# Patient Record
Sex: Male | Born: 1965 | Race: White | Hispanic: No | Marital: Married | Smoking: Never smoker
Health system: Southern US, Community
[De-identification: ages and names within clinical notes are randomized; demographics above are authoritative.]

## PROBLEM LIST (undated history)

## (undated) DIAGNOSIS — I10 Essential (primary) hypertension: Secondary | ICD-10-CM

---

## 2020-03-30 ENCOUNTER — Encounter (HOSPITAL_BASED_OUTPATIENT_CLINIC_OR_DEPARTMENT_OTHER): Payer: Self-pay | Admitting: *Deleted

## 2020-03-30 ENCOUNTER — Emergency Department (HOSPITAL_BASED_OUTPATIENT_CLINIC_OR_DEPARTMENT_OTHER)
Admission: EM | Admit: 2020-03-30 | Discharge: 2020-03-30 | Disposition: A | Discharge status: Home / Self Care | Payer: BC Managed Care – PPO | Attending: Emergency Medicine | Admitting: Emergency Medicine

## 2020-03-30 ENCOUNTER — Other Ambulatory Visit: Payer: Self-pay

## 2020-03-30 DIAGNOSIS — E669 Obesity, unspecified: Secondary | ICD-10-CM | POA: Insufficient documentation

## 2020-03-30 DIAGNOSIS — X58XXXA Exposure to other specified factors, initial encounter: Secondary | ICD-10-CM | POA: Insufficient documentation

## 2020-03-30 DIAGNOSIS — S0502XA Injury of conjunctiva and corneal abrasion without foreign body, left eye, initial encounter: Secondary | ICD-10-CM | POA: Diagnosis present

## 2020-03-30 DIAGNOSIS — Y999 Unspecified external cause status: Secondary | ICD-10-CM | POA: Insufficient documentation

## 2020-03-30 DIAGNOSIS — H5712 Ocular pain, left eye: Secondary | ICD-10-CM | POA: Diagnosis not present

## 2020-03-30 DIAGNOSIS — Y929 Unspecified place or not applicable: Secondary | ICD-10-CM | POA: Diagnosis not present

## 2020-03-30 DIAGNOSIS — I10 Essential (primary) hypertension: Secondary | ICD-10-CM | POA: Diagnosis not present

## 2020-03-30 DIAGNOSIS — Y939 Activity, unspecified: Secondary | ICD-10-CM | POA: Insufficient documentation

## 2020-03-30 DIAGNOSIS — Z79899 Other long term (current) drug therapy: Secondary | ICD-10-CM | POA: Insufficient documentation

## 2020-03-30 HISTORY — DX: Essential (primary) hypertension: I10

## 2020-03-30 MED ORDER — CYCLOPENTOLATE HCL 1 % OP SOLN
2.0000 [drp] | Freq: Once | OPHTHALMIC | Status: AC
  Administered 2020-03-30: 2 [drp] via OPHTHALMIC
  Filled 2020-03-30: qty 2

## 2020-03-30 MED ORDER — CIPROFLOXACIN HCL 0.3 % OP SOLN: 2.0000 [drp] | OPHTHALMIC | 0 refills | Status: AC

## 2020-03-30 MED ORDER — TETRACAINE HCL 0.5 % OP SOLN
2.0000 [drp] | Freq: Once | OPHTHALMIC | Status: AC
  Administered 2020-03-30: 2 [drp] via OPHTHALMIC
  Filled 2020-03-30: qty 4

## 2020-03-30 MED ORDER — HYDROCODONE-ACETAMINOPHEN 5-325 MG PO TABS: 1.0000 | ORAL_TABLET | ORAL | 0 refills | Status: DC | PRN

## 2020-03-30 MED ORDER — IBUPROFEN 600 MG PO TABS: 600.0000 mg | ORAL_TABLET | Freq: Four times a day (QID) | ORAL | 0 refills | Status: DC | PRN

## 2020-03-30 MED ORDER — CIPROFLOXACIN HCL 0.3 % OP SOLN
2.0000 [drp] | Freq: Once | OPHTHALMIC | Status: DC
  Filled 2020-03-30: qty 2.5

## 2020-03-30 MED ORDER — FLUORESCEIN SODIUM 1 MG OP STRP
1.0000 | ORAL_STRIP | Freq: Once | OPHTHALMIC | Status: AC
  Administered 2020-03-30: 1 via OPHTHALMIC
  Filled 2020-03-30: qty 1

## 2020-03-30 NOTE — ED Notes (Signed)
Attempted visual acuity, Pt stated that he could not read the sign due to hot having his glasses. Pt was able to read his labels up close.

## 2020-03-30 NOTE — ED Provider Notes (Signed)
[facility] EMERGENCY DEPARTMENT Provider Note   CSN: [id number] Arrival date & time: 03/30/20  1913     History Chief Complaint  Patient presents with  . Eye Problem    Sergio Mcdonald is a 54 y.o. male.  Pt presents to the ED today with left eye pain.  It started today.  He felt like something went in it and then it has been hurting ever since.  Pt denies any f/c.         Past Medical History:  Diagnosis Date  . Hypertension     There are no problems to display for this patient.   History reviewed. No pertinent surgical history.     History reviewed. No pertinent family history.  Social History   Tobacco Use  . Smoking status: Never Smoker  . Smokeless tobacco: Never Used  Substance Use Topics  . Alcohol use: Not Currently  . Drug use: Not Currently    Home Medications Prior to Admission medications   Medication Sig Start Date End Date Taking? Authorizing Provider  ciprofloxacin (CILOXAN) 0.3 % ophthalmic solution Place 2 drops into the left eye every 2 (two) hours while awake. Administer 1 drop, every 2 hours, while awake, for 2 days. Then 1 drop, every 4 hours, while awake, for the next 5 days. 03/30/20   Jacalyn Lefevre, MD  HYDROcodone-acetaminophen (NORCO/VICODIN) 5-325 MG tablet Take 1 tablet by mouth every 4 (four) hours as needed. 03/30/20   Jacalyn Lefevre, MD  ibuprofen (ADVIL) 600 MG tablet Take 1 tablet (600 mg total) by mouth every 6 (six) hours as needed. 03/30/20   Jacalyn Lefevre, MD    Allergies    Penicillins and Zithromax [azithromycin]  Review of Systems   Review of Systems  Eyes: Positive for photophobia, pain and redness.  All other systems reviewed and are negative.   Physical Exam Updated Vital Signs BP (!) 163/112 (BP Location: Right Arm)   Pulse 97   Temp 98.4 F (36.9 C) (Oral)   Resp 20   Ht 5\' 8"  (1.727 m)   Wt 127 kg   SpO2 95%   BMI 42.57 kg/m   Physical Exam Vitals and nursing note reviewed.    Constitutional:      Appearance: Normal appearance. He is obese.  HENT:     Head: Normocephalic and atraumatic.     Right Ear: External ear normal.     Left Ear: External ear normal.     Nose: Nose normal.     Mouth/Throat:     Mouth: Mucous membranes are moist.     Pharynx: Oropharynx is clear.  Eyes:     Conjunctiva/sclera:     Left eye: Left conjunctiva is injected.     Pupils: Pupils are equal, round, and reactive to light.     Left eye: Corneal abrasion present.  Cardiovascular:     Rate and Rhythm: Normal rate and regular rhythm.     Pulses: Normal pulses.     Heart sounds: Normal heart sounds.  Pulmonary:     Effort: Pulmonary effort is normal.     Breath sounds: Normal breath sounds.  Abdominal:     General: Abdomen is flat. Bowel sounds are normal.     Palpations: Abdomen is soft.  Musculoskeletal:        General: Normal range of motion.     Cervical back: Normal range of motion and neck supple.  Skin:    General: Skin is warm.  Capillary Refill: Capillary refill takes less than 2 seconds.  Neurological:     General: No focal deficit present.     Mental Status: He is alert and oriented to person, place, and time.  Psychiatric:        Mood and Affect: Mood normal.        Behavior: Behavior normal.     ED Results / Procedures / Treatments   Labs (all labs ordered are listed, but only abnormal results are displayed) Labs Reviewed - No data to display  EKG None  Radiology No results found.  Procedures Procedures (including critical care time)  Medications Ordered in ED Medications  fluorescein ophthalmic strip 1 strip (1 strip Left Eye Given 03/30/20 2149)  tetracaine (PONTOCAINE) 0.5 % ophthalmic solution 2 drop (2 drops Left Eye Given by Other 03/30/20 2148)  cyclopentolate (CYCLODRYL,CYCLOGYL) 1 % ophthalmic solution 2 drop (2 drops Left Eye Given 03/30/20 2222)    ED Course  I have reviewed the triage vital signs and the nursing  notes.  Pertinent labs & imaging results that were available during my care of the patient were reviewed by me and considered in my medical decision making (see chart for details).    MDM Rules/Calculators/A&P                           Pt with a large corneal abrasion.  He is unable to see the visual acuity eye chart with either eye (he did not bring his glasses).  Pt is to f/u with ophthalmology.  Return if worse.  Final Clinical Impression(s) / ED Diagnoses Final diagnoses:  Abrasion of left cornea, initial encounter    Rx / DC Orders ED Discharge Orders         Ordered    ciprofloxacin (CILOXAN) 0.3 % ophthalmic solution  Every 2 hours while awake        03/30/20 2222    HYDROcodone-acetaminophen (NORCO/VICODIN) 5-325 MG tablet  Every 4 hours PRN        03/30/20 2222    ibuprofen (ADVIL) 600 MG tablet  Every 6 hours PRN        03/30/20 2222           Jacalyn Lefevre, MD 03/30/20 2226

## 2020-03-30 NOTE — ED Triage Notes (Signed)
Pt reports left eye burning and pain x 1 days. Unknown injury.

## 2020-04-14 ENCOUNTER — Other Ambulatory Visit: Payer: Self-pay

## 2020-04-14 ENCOUNTER — Inpatient Hospital Stay (HOSPITAL_BASED_OUTPATIENT_CLINIC_OR_DEPARTMENT_OTHER)
Admission: EM | Admit: 2020-04-14 | Discharge: 2020-04-17 | DRG: 177 | Disposition: A | Discharge status: Home / Self Care | Payer: BC Managed Care – PPO | Attending: Internal Medicine | Admitting: Internal Medicine

## 2020-04-14 ENCOUNTER — Encounter (HOSPITAL_BASED_OUTPATIENT_CLINIC_OR_DEPARTMENT_OTHER): Payer: Self-pay | Admitting: Emergency Medicine

## 2020-04-14 ENCOUNTER — Emergency Department (HOSPITAL_BASED_OUTPATIENT_CLINIC_OR_DEPARTMENT_OTHER): Payer: BC Managed Care – PPO

## 2020-04-14 DIAGNOSIS — U071 COVID-19: Secondary | ICD-10-CM | POA: Diagnosis present

## 2020-04-14 DIAGNOSIS — I10 Essential (primary) hypertension: Secondary | ICD-10-CM | POA: Diagnosis present

## 2020-04-14 DIAGNOSIS — E669 Obesity, unspecified: Secondary | ICD-10-CM

## 2020-04-14 DIAGNOSIS — R0602 Shortness of breath: Secondary | ICD-10-CM | POA: Diagnosis present

## 2020-04-14 DIAGNOSIS — Z79899 Other long term (current) drug therapy: Secondary | ICD-10-CM

## 2020-04-14 DIAGNOSIS — J9601 Acute respiratory failure with hypoxia: Secondary | ICD-10-CM | POA: Diagnosis present

## 2020-04-14 DIAGNOSIS — J1282 Pneumonia due to coronavirus disease 2019: Secondary | ICD-10-CM | POA: Diagnosis present

## 2020-04-14 DIAGNOSIS — R0902 Hypoxemia: Secondary | ICD-10-CM | POA: Diagnosis not present

## 2020-04-14 DIAGNOSIS — Z6841 Body Mass Index (BMI) 40.0 and over, adult: Secondary | ICD-10-CM

## 2020-04-14 LAB — CBC WITH DIFFERENTIAL/PLATELET
Abs Immature Granulocytes: 0.03 10*3/uL (ref 0.00–0.07)
Basophils Absolute: 0 10*3/uL (ref 0.0–0.1)
Basophils Relative: 0 %
Eosinophils Absolute: 0 10*3/uL (ref 0.0–0.5)
Eosinophils Relative: 0 %
HCT: 43.2 % (ref 39.0–52.0)
Hemoglobin: 14.5 g/dL (ref 13.0–17.0)
Immature Granulocytes: 0 %
Lymphocytes Relative: 7 %
Lymphs Abs: 0.5 10*3/uL — ABNORMAL LOW (ref 0.7–4.0)
MCH: 27.5 pg (ref 26.0–34.0)
MCHC: 33.6 g/dL (ref 30.0–36.0)
MCV: 82 fL (ref 80.0–100.0)
Monocytes Absolute: 0.3 10*3/uL (ref 0.1–1.0)
Monocytes Relative: 4 %
Neutro Abs: 5.9 10*3/uL (ref 1.7–7.7)
Neutrophils Relative %: 89 %
Platelets: 229 10*3/uL (ref 150–400)
RBC: 5.27 MIL/uL (ref 4.22–5.81)
RDW: 13.2 % (ref 11.5–15.5)
WBC: 6.7 10*3/uL (ref 4.0–10.5)
nRBC: 0 % (ref 0.0–0.2)

## 2020-04-14 LAB — COMPREHENSIVE METABOLIC PANEL
ALT: 57 U/L — ABNORMAL HIGH (ref 0–44)
AST: 43 U/L — ABNORMAL HIGH (ref 15–41)
Albumin: 3.6 g/dL (ref 3.5–5.0)
Alkaline Phosphatase: 33 U/L — ABNORMAL LOW (ref 38–126)
Anion gap: 13 (ref 5–15)
BUN: 19 mg/dL (ref 6–20)
CO2: 25 mmol/L (ref 22–32)
Calcium: 8.2 mg/dL — ABNORMAL LOW (ref 8.9–10.3)
Chloride: 94 mmol/L — ABNORMAL LOW (ref 98–111)
Creatinine, Ser: 1.07 mg/dL (ref 0.61–1.24)
GFR calc Af Amer: >60 mL/min (ref >60)
GFR calc non Af Amer: >60 mL/min (ref >60)
Glucose, Bld: 126 mg/dL — ABNORMAL HIGH (ref 70–99)
Potassium: 3.6 mmol/L (ref 3.5–5.1)
Sodium: 132 mmol/L — ABNORMAL LOW (ref 135–145)
Total Bilirubin: 0.7 mg/dL (ref 0.3–1.2)
Total Protein: 6.9 g/dL (ref 6.5–8.1)

## 2020-04-14 LAB — URINALYSIS, ROUTINE W REFLEX MICROSCOPIC
Bilirubin Urine: NEGATIVE
Glucose, UA: NEGATIVE mg/dL
Hgb urine dipstick: NEGATIVE
Ketones, ur: NEGATIVE mg/dL
Leukocytes,Ua: NEGATIVE
Nitrite: NEGATIVE
Protein, ur: NEGATIVE mg/dL
Specific Gravity, Urine: 1.015 (ref 1.005–1.030)
pH: 6 (ref 5.0–8.0)

## 2020-04-14 LAB — PROCALCITONIN: Procalcitonin: 0.14 ng/mL

## 2020-04-14 LAB — TRIGLYCERIDES: Triglycerides: 95 mg/dL (ref <150)

## 2020-04-14 LAB — D-DIMER, QUANTITATIVE: D-Dimer, Quant: 0.75 ug/mL-FEU — ABNORMAL HIGH (ref 0.00–0.50)

## 2020-04-14 LAB — C-REACTIVE PROTEIN: CRP: 7.7 mg/dL — ABNORMAL HIGH (ref <1.0)

## 2020-04-14 LAB — LACTIC ACID, PLASMA: Lactic Acid, Venous: 1.1 mmol/L (ref 0.5–1.9)

## 2020-04-14 LAB — FERRITIN: Ferritin: 1886 ng/mL — ABNORMAL HIGH (ref 24–336)

## 2020-04-14 LAB — LACTATE DEHYDROGENASE: LDH: 354 U/L — ABNORMAL HIGH (ref 98–192)

## 2020-04-14 LAB — FIBRINOGEN: Fibrinogen: 686 mg/dL — ABNORMAL HIGH (ref 210–475)

## 2020-04-14 MED ORDER — ENOXAPARIN SODIUM 60 MG/0.6ML ~~LOC~~ SOLN
60.0000 mg | SUBCUTANEOUS | Status: DC
  Administered 2020-04-14 – 2020-04-16 (×3): 60 mg via SUBCUTANEOUS
  Filled 2020-04-14 (×3): qty 0.6

## 2020-04-14 MED ORDER — METHYLPREDNISOLONE SODIUM SUCC 125 MG IJ SOLR: 1.0000 mg/kg | Freq: Two times a day (BID) | INTRAMUSCULAR | Status: DC

## 2020-04-14 MED ORDER — TOBRAMYCIN-DEXAMETHASONE 0.3-0.1 % OP SUSP
1.0000 [drp] | OPHTHALMIC | Status: DC
  Administered 2020-04-14 – 2020-04-17 (×13): 1 [drp] via OPHTHALMIC
  Filled 2020-04-14: qty 2.5

## 2020-04-14 MED ORDER — LISINOPRIL 40 MG PO TABS
40.0000 mg | ORAL_TABLET | Freq: Every day | ORAL | Status: DC
  Administered 2020-04-14 – 2020-04-17 (×4): 40 mg via ORAL
  Filled 2020-04-14 (×4): qty 1

## 2020-04-14 MED ORDER — SODIUM CHLORIDE 0.9 % IV SOLN
100.0000 mg | Freq: Every day | INTRAVENOUS | Status: DC
  Administered 2020-04-15 – 2020-04-17 (×3): 100 mg via INTRAVENOUS
  Filled 2020-04-14 (×3): qty 20

## 2020-04-14 MED ORDER — PREDNISONE 20 MG PO TABS: 50.0000 mg | ORAL_TABLET | Freq: Every day | ORAL | Status: DC

## 2020-04-14 MED ORDER — SODIUM CHLORIDE 0.9 % IV SOLN
100.0000 mg | INTRAVENOUS | Status: AC
  Administered 2020-04-14: 100 mg via INTRAVENOUS
  Filled 2020-04-14 (×2): qty 20

## 2020-04-14 MED ORDER — CIPROFLOXACIN HCL 0.3 % OP SOLN
2.0000 [drp] | OPHTHALMIC | Status: DC
  Filled 2020-04-14: qty 2.5

## 2020-04-14 MED ORDER — HYDROCOD POLST-CPM POLST ER 10-8 MG/5ML PO SUER: 5.0000 mL | Freq: Two times a day (BID) | ORAL | Status: DC | PRN

## 2020-04-14 MED ORDER — GUAIFENESIN-DM 100-10 MG/5ML PO SYRP: 10.0000 mL | ORAL_SOLUTION | ORAL | Status: DC | PRN

## 2020-04-14 MED ORDER — DEXAMETHASONE SODIUM PHOSPHATE 10 MG/ML IJ SOLN
10.0000 mg | Freq: Once | INTRAMUSCULAR | Status: AC
  Administered 2020-04-14: 10 mg via INTRAVENOUS
  Filled 2020-04-14: qty 1

## 2020-04-14 MED ORDER — PANTOPRAZOLE SODIUM 40 MG PO TBEC
40.0000 mg | DELAYED_RELEASE_TABLET | Freq: Every day | ORAL | Status: DC
  Administered 2020-04-14 – 2020-04-17 (×4): 40 mg via ORAL
  Filled 2020-04-14 (×4): qty 1

## 2020-04-14 MED ORDER — METHYLPREDNISOLONE SODIUM SUCC 125 MG IJ SOLR
80.0000 mg | Freq: Two times a day (BID) | INTRAMUSCULAR | Status: AC
  Administered 2020-04-14 – 2020-04-17 (×6): 80 mg via INTRAVENOUS
  Filled 2020-04-14 (×6): qty 2

## 2020-04-14 MED ORDER — SODIUM CHLORIDE 0.9 % IV SOLN
INTRAVENOUS | Status: DC | PRN
  Administered 2020-04-14: 500 mL via INTRAVENOUS

## 2020-04-14 MED ORDER — SODIUM CHLORIDE 0.9 % IV SOLN
100.0000 mg | Freq: Every day | INTRAVENOUS | Status: DC
  Administered 2020-04-14: 100 mg via INTRAVENOUS

## 2020-04-14 NOTE — ED Provider Notes (Signed)
[facility] EMERGENCY DEPARTMENT Provider Note   CSN: [id number] Arrival date & time: 04/14/20  0028     History Chief Complaint  Patient presents with  . Shortness of Breath    Sergio Mcdonald is a 54 y.o. male.  HPI     This a 54 year old male with a history of hypertension who presents with shortness of breath.  Patient was diagnosed with COVID-19 on 9/7.  He reports worsening shortness of breath and cough.  No fevers, nausea, vomiting, diarrhea.  Denies chest pain.  Shortness of breath that significantly worsened with exertion.  He did receive outpatient Regeneron therapy.  He is a non-smoker.  No history of asthma.  Past Medical History:  Diagnosis Date  . Hypertension     There are no problems to display for this patient.   History reviewed. No pertinent surgical history.     No family history on file.  Social History   Tobacco Use  . Smoking status: Never Smoker  . Smokeless tobacco: Never Used  Substance Use Topics  . Alcohol use: Not Currently  . Drug use: Not Currently    Home Medications Prior to Admission medications   Medication Sig Start Date End Date Taking? Authorizing Provider  lisinopril (ZESTRIL) 40 MG tablet Take 40 mg by mouth daily.   Yes [provider]  omeprazole (PRILOSEC) 40 MG capsule Take 40 mg by mouth daily.   Yes [provider]  ciprofloxacin (CILOXAN) 0.3 % ophthalmic solution Place 2 drops into the left eye every 2 (two) hours while awake. Administer 1 drop, every 2 hours, while awake, for 2 days. Then 1 drop, every 4 hours, while awake, for the next 5 days. 03/30/20   Jacalyn Lefevre, MD  HYDROcodone-acetaminophen (NORCO/VICODIN) 5-325 MG tablet Take 1 tablet by mouth every 4 (four) hours as needed. 03/30/20   Jacalyn Lefevre, MD  ibuprofen (ADVIL) 600 MG tablet Take 1 tablet (600 mg total) by mouth every 6 (six) hours as needed. 03/30/20   Jacalyn Lefevre, MD    Allergies    Penicillins and Zithromax  [azithromycin]  Review of Systems   Review of Systems  Constitutional: Negative for fever.  Respiratory: Positive for cough and shortness of breath.   Cardiovascular: Negative for chest pain.  Gastrointestinal: Negative for abdominal pain, nausea and vomiting.  Genitourinary: Negative for dysuria.  All other systems reviewed and are negative.   Physical Exam Updated Vital Signs BP 112/77   Pulse 80   Temp 98.2 F (36.8 C) (Oral)   Resp 18   Ht 1.727 m (5\' 8" )   Wt 124.7 kg   SpO2 97%   BMI 41.81 kg/m   Physical Exam Vitals and nursing note reviewed.  Constitutional:      Appearance: He is well-developed. He is obese. He is not ill-appearing.  HENT:     Head: Normocephalic and atraumatic.     Mouth/Throat:     Mouth: Mucous membranes are moist.  Eyes:     Pupils: Pupils are equal, round, and reactive to light.  Cardiovascular:     Rate and Rhythm: Normal rate and regular rhythm.     Heart sounds: Normal heart sounds. No murmur heard.   Pulmonary:     Effort: Pulmonary effort is normal. No respiratory distress.     Breath sounds: Normal breath sounds. No wheezing.     Comments: Distant breath sounds in all lung fields Abdominal:     General: Bowel sounds are normal.  Palpations: Abdomen is soft.     Tenderness: There is no abdominal tenderness. There is no rebound.  Musculoskeletal:     Cervical back: Neck supple.     Right lower leg: No tenderness. No edema.     Left lower leg: No tenderness. No edema.  Lymphadenopathy:     Cervical: No cervical adenopathy.  Skin:    General: Skin is warm and dry.  Neurological:     Mental Status: He is alert and oriented to person, place, and time.  Psychiatric:        Mood and Affect: Mood normal.     ED Results / Procedures / Treatments   Labs (all labs ordered are listed, but only abnormal results are displayed) Labs Reviewed  CBC WITH DIFFERENTIAL/PLATELET - Abnormal; Notable for the following components:       Result Value   Lymphs Abs 0.5 (*)    All other components within normal limits  COMPREHENSIVE METABOLIC PANEL - Abnormal; Notable for the following components:   Sodium 132 (*)    Chloride 94 (*)    Glucose, Bld 126 (*)    Calcium 8.2 (*)    AST 43 (*)    ALT 57 (*)    Alkaline Phosphatase 33 (*)    All other components within normal limits  D-DIMER, QUANTITATIVE (NOT AT [facility]) - Abnormal; Notable for the following components:   D-Dimer, Quant 0.75 (*)    All other components within normal limits  CULTURE, BLOOD (ROUTINE X 2)  CULTURE, BLOOD (ROUTINE X 2)  LACTIC ACID, PLASMA  PROCALCITONIN  LACTATE DEHYDROGENASE  FERRITIN  TRIGLYCERIDES  FIBRINOGEN  C-REACTIVE PROTEIN    EKG EKG Interpretation  Date/Time:  Monday April 14 2020 00:40:37 EDT Ventricular Rate:  91 PR Interval:  156 QRS Duration: 98 QT Interval:  354 QTC Calculation: 435 R Axis:   3 Text Interpretation: Normal sinus rhythm Normal ECG Confirmed by Ross Marcus ([id number]) on 04/14/2020 1:43:09 AM   Radiology DG Chest Port 1 View  Result Date: 04/14/2020 CLINICAL DATA:  COVID positive with symptoms since September 3rd. EXAM: PORTABLE CHEST 1 VIEW COMPARISON:  August 22, 2018 FINDINGS: Mildly decreased lung volumes are seen which is likely, in part, secondary to suboptimal patient inspiration. Mild ill-defined areas of atelectasis and/or early infiltrate are noted within the bilateral lung bases. There is no evidence of a pleural effusion or pneumothorax. The cardiac silhouette is mildly enlarged and unchanged in size. The visualized skeletal structures are unremarkable. IMPRESSION: 1. Mild ill-defined bibasilar atelectasis and/or early infiltrate. Electronically Signed   By: Aram Candela M.D.   On: 04/14/2020 01:29    Procedures .Critical Care Performed by: Shon Baton, MD Authorized by: Shon Baton, MD   Critical care provider statement:    Critical care time (minutes):  35    Critical care was time spent personally by me on the following activities:  Discussions with consultants, evaluation of patient's response to treatment, examination of patient, ordering and performing treatments and interventions, ordering and review of laboratory studies, ordering and review of radiographic studies, pulse oximetry, re-evaluation of patient's condition, obtaining history from patient or surrogate and review of old charts   (including critical care time)    Medications Ordered in ED Medications  dexamethasone (DECADRON) injection 10 mg (has no administration in time range)    ED Course  I have reviewed the triage vital signs and the nursing notes.  Pertinent labs & imaging results that were available during  my care of the patient were reviewed by me and considered in my medical decision making (see chart for details).    MDM Rules/Calculators/A&P                           Patient presents with shortness of breath.  Positive for COVID-19 on 9/7.  He has had progressively worsening shortness of breath.  Initial O2 sats 89 to 90%.  He does seem to improve with rest but has significant tachypnea and hypoxia with ambulation.  He was placed on 2 L of oxygen for support.  He has a history of hypertension and obesity but no other risk factors.  He is a non-smoker.  He has received outpatient monoclonal antibodies.  Work-up initiated.  Chest x-ray shows early infiltrates.  Likely consistent with COVID-19 pneumonia.  Lactate is normal.  White count is normal.  Lab work-up is largely reassuring.  Several preadmission Covid labs are pending.  However, given oxygen requirement, feel he will need admission and will likely worsen given course of illness.  Patient was given Decadron and remdesivir was ordered.  We will plan for admission to the hospitalist.  Sergio Mcdonald was evaluated in Emergency Department on 04/14/2020 for the symptoms described in the history of present illness. He was  evaluated in the context of the global COVID-19 pandemic, which necessitated consideration that the patient might be at risk for infection with the SARS-CoV-2 virus that causes COVID-19. Institutional protocols and algorithms that pertain to the evaluation of patients at risk for COVID-19 are in a state of rapid change based on information released by regulatory bodies including the CDC and federal and state organizations. These policies and algorithms were followed during the patient's care in the ED.   Final Clinical Impression(s) / ED Diagnoses Final diagnoses:  Shortness of breath  Hypoxia  COVID-19    Rx / DC Orders ED Discharge Orders    None       Raymel Cull, Mayer Masker, MD 04/14/20 0422

## 2020-04-14 NOTE — ED Notes (Addendum)
PT urine sent to lab. Abnormal color noted. Culture obtained.

## 2020-04-14 NOTE — ED Triage Notes (Signed)
Reports he was diagnosed with covid on Tuesday.  SOB has gotten increasingly worse.  Noted to be labored with exertion.

## 2020-04-14 NOTE — ED Notes (Signed)
Attempted to call pts wife at his request. No answer.

## 2020-04-14 NOTE — ED Notes (Signed)
RN updated pts wife on his condition at his request. Heidi : [phone].

## 2020-04-14 NOTE — ED Notes (Signed)
RN to room to hang second bag of Remdesivir. First bag still full despite hanging as piggy back with clamp unlocked. Secondary set changed to new tubing, med infusing.

## 2020-04-14 NOTE — H&P (Signed)
[facility] H&P   Patient Demographics:    Lieutenant Abarca, is a 54 y.o. male  MRN: [id number]   DOB - 1966/07/16  Admit Date - 04/14/2020  Outpatient Primary MD for the patient is Patient, No Pcp Per  Referring MD/NP/PA: from [facility] accepted by Dr. Leafy Half.  Patient coming [facility]: [facility]  Chief Complaint  Patient presents with  . Shortness of Breath    + covid      HPI:    Secundino Ellithorpe  is a 54 y.o. male, with past medical history of hypertension, obesity, patient started with COVID-19 symptoms 9/3, he tested positive on 9/7, patient did receive monoclonal antibody 9/10 at [facility], patient does report developing dyspnea over last 2 days, report is mainly exertional, as well he does report cough, nonproductive, he does report intermittent fever and chills, he does report generalized weakness, fatigue, but he denies nausea, vomiting, diarrhea, abdominal pain, patient is unvaccinated against Covid, as well reports his wife has tested positive as well, she is vaccinated, she has no symptoms. -Patient presents to [facility] with dyspnea, chest x-ray significant for bibasilar opacities, T-max 99.8, CRP elevated at 7.7, ferritin elevated at 1.8, his LFTs mildly elevated with AST at 43, ALT at 57, patient was started on IV steroids, remdesivir, and [facility] hospitalist requested to admit.    Review of systems:    In addition to the HPI above,  He reports fever and chills,he reports fatigue No Headache, No changes with Vision or hearing, No problems swallowing food or Liquids, No Chest pain,he reports  Cough and Shortness of Breath, No Abdominal pain, No Nausea or Vommitting, Bowel movements are regular, No Blood in stool or Urine, No dysuria, No new skin rashes or bruises, No new joints pains-aches,  No new weakness, tingling, numbness in any extremity, No recent weight gain or loss, No polyuria,  polydypsia or polyphagia, No significant Mental Stressors.  A full 10 point Review of Systems was done, except as stated above, all other Review of Systems were negative.   With Past History of the following :    Past Medical History:  Diagnosis Date  . Hypertension       History reviewed. No pertinent surgical history.    Social History:     Social History   Tobacco Use  . Smoking status: Never Smoker  . Smokeless tobacco: Never Used  Substance Use Topics  . Alcohol use: Not Currently       Family History :   Family history was reviewed, nonpertinent   Home Medications:   Prior to Admission medications   Medication Sig Start Date End Date Taking? Authorizing Provider  ciprofloxacin (CILOXAN) 0.3 % ophthalmic solution Place 2 drops into the left eye every 2 (two) hours while awake. Administer 1 drop, every 2 hours, while awake, for 2 days. Then 1 drop, every 4 hours, while awake, for the  next 5 days. 03/30/20  Yes Jacalyn Lefevre, MD  dexamethasone (DECADRON) 4 MG tablet Take 4 mg by mouth See admin instructions. Take 2 tablets by mouth on days 1 and 2, then 1.5 tabs on days 3 and 4, then 1 tab on days 5 and 6, then 0.5 tab on days 7 and 8.   Yes [provider]  doxycycline (VIBRA-TABS) 100 MG tablet Take 100 mg by mouth 2 (two) times daily. 04/11/20  Yes [provider]  lisinopril (ZESTRIL) 40 MG tablet Take 40 mg by mouth daily.   Yes [provider]  omeprazole (PRILOSEC) 40 MG capsule Take 40 mg by mouth daily.   Yes [provider]  tobramycin-dexamethasone Wallene Dales) ophthalmic solution Place 1 drop into the left eye in the morning, at noon, and at bedtime.     [provider]     Allergies:     Allergies  Allergen Reactions  . Penicillins   . Zithromax [Azithromycin]      Physical Exam:   Vitals  Blood pressure 126/82, pulse 89, temperature 99 F (37.2 C), temperature source Oral, resp. rate 20, height 5'  8" (1.727 m), weight 124.7 kg, SpO2 94 %.   1. General well developed  lying in bed in NAD,   2. Normal affect and insight, Not Suicidal or Homicidal, Awake Alert, Oriented X 3.  3. No F.N deficits, ALL C.Nerves Intact, Strength 5/5 all 4 extremities, Sensation intact all 4 extremities, Plantars down going.  4. Ears and Eyes appear Normal, Conjunctivae clear, PERRLA. Moist Oral Mucosa.  5. Supple Neck, No JVD, No cervical lymphadenopathy appriciated, No Carotid Bruits.  6. Symmetrical Chest wall movement, Good air movement bilaterally, CTAB.  7. RRR, No Gallops, Rubs or Murmurs, No Parasternal Heave.  8. Positive Bowel Sounds, Abdomen Soft, No tenderness, No organomegaly appriciated,No rebound -guarding or rigidity.  9.  No Cyanosis, Normal Skin Turgor, No Skin Rash or Bruise.  10. Good muscle tone,  joints appear normal , no effusions, Normal ROM.  11. No Palpable Lymph Nodes in Neck or Axillae    Data Review:    CBC Recent Labs  Lab 04/14/20 0133  WBC 6.7  HGB 14.5  HCT 43.2  PLT 229  MCV 82.0  MCH 27.5  MCHC 33.6  RDW 13.2  LYMPHSABS 0.5*  MONOABS 0.3  EOSABS 0.0  BASOSABS 0.0   ------------------------------------------------------------------------------------------------------------------  Chemistries  Recent Labs  Lab 04/14/20 0133  NA 132*  K 3.6  CL 94*  CO2 25  GLUCOSE 126*  BUN 19  CREATININE 1.07  CALCIUM 8.2*  AST 43*  ALT 57*  ALKPHOS 33*  BILITOT 0.7   ------------------------------------------------------------------------------------------------------------------ estimated creatinine clearance is 99.1 mL/min (by C-G formula based on SCr of 1.07 mg/dL). ------------------------------------------------------------------------------------------------------------------ No results for input(s): TSH, T4TOTAL, T3FREE, THYROIDAB in the last 72 hours.  Invalid input(s): FREET3  Coagulation profile No results for input(s): INR, PROTIME in  the last 168 hours. ------------------------------------------------------------------------------------------------------------------- Recent Labs    04/14/20 0133  DDIMER 0.75*   -------------------------------------------------------------------------------------------------------------------  Cardiac Enzymes No results for input(s): CKMB, TROPONINI, MYOGLOBIN in the last 168 hours.  Invalid input(s): CK ------------------------------------------------------------------------------------------------------------------ No results found for: BNP   ---------------------------------------------------------------------------------------------------------------  Urinalysis    Component Value Date/Time   COLORURINE YELLOW 04/14/2020 1329   APPEARANCEUR CLEAR 04/14/2020 1329   LABSPEC 1.015 04/14/2020 1329   PHURINE 6.0 04/14/2020 1329   GLUCOSEU NEGATIVE 04/14/2020 1329   HGBUR NEGATIVE 04/14/2020 1329   BILIRUBINUR NEGATIVE 04/14/2020 1329  KETONESUR NEGATIVE 04/14/2020 1329   PROTEINUR NEGATIVE 04/14/2020 1329   NITRITE NEGATIVE 04/14/2020 1329   LEUKOCYTESUR NEGATIVE 04/14/2020 1329    ----------------------------------------------------------------------------------------------------------------   Imaging Results:    DG Chest Port 1 View  Result Date: 04/14/2020 CLINICAL DATA:  COVID positive with symptoms since September 3rd. EXAM: PORTABLE CHEST 1 VIEW COMPARISON:  August 22, 2018 FINDINGS: Mildly decreased lung volumes are seen which is likely, in part, secondary to suboptimal patient inspiration. Mild ill-defined areas of atelectasis and/or early infiltrate are noted within the bilateral lung bases. There is no evidence of a pleural effusion or pneumothorax. The cardiac silhouette is mildly enlarged and unchanged in size. The visualized skeletal structures are unremarkable. IMPRESSION: 1. Mild ill-defined bibasilar atelectasis and/or early infiltrate. Electronically  Signed   By: Aram Candela M.D.   On: 04/14/2020 01:29    My personal review of EKG: Rhythm NSR, Rate  91 /min, QTc 435 , no Acute ST changes   Assessment & Plan:    Active Problems:   COVID-19 virus infection   Essential hypertension   Obesity  COVID-19 pneumonia -Patient presents with dyspnea, cough, progressive over last 3 days, chest x-ray significant for bibasilar multifocal opacity. -He is admitted under COVID-19 pathway, I have discussed with the patient, he was encouraged with incentive spirometry, flutter valve, and out of bed to chair daily, report he is unable to prone, so discussed about modified proning, said he will try. -Continue with IV remdesivir. -Continue with IV steroids, will start on IV Solu-Medrol. -So far no indication for Actemra/baricitinib. -We will monitor inflammatory markers closely. -Procalcitonin is at 0.14, so far no indication for antibiotics, will trend  Hypertension -Continue home medications.  Left eye abrasion -During recent ED visit 8/29, continue home eyedrops  Transaminitis -Most likely related to Covid, mild, will monitor  Obesity with BMI of 41.8 -Weight loss counseling as an outpatient once stable.   DVT Prophylaxis  Lovenox   AM Labs Ordered, also please review Full Orders  Family Communication: Admission, patients condition and plan of care including tests being ordered have been discussed with the patient and wife who indicate understanding and agree with the plan and Code Status.  Code Status Full  Likely DC to  Home  Condition GUARDED   Consults called: none  Admission status:  inpatient  Time spent in minutes : 50 minutes   Huey Bienenstock M.D on 04/14/2020 at 5:57 PM   [facility] - Office  [phone]

## 2020-04-14 NOTE — Progress Notes (Signed)
Pharmacy Antibiotic Note  Sergio Mcdonald is a 54 y.o. male admitted on 04/14/2020 with SOB and COVID pneumonia; pt was diagnosed with COVID on 9/7/1 and is S/P outpt monoclonal antibody infusion.  Pharmacy has been consulted for remdesivir dosing.  Pt rec'd 2 X 100 mg doses of IV remdesivir earlier today in ED (at 0519, 0751).  WBC 6.7, afebrile; LFTs pending  Plan: Remdesivir 100 mg IV daily X 4 doses, starting tomorrow (04/15/20) F/U LFTs Monitor LFTs daily while on remdesivir  Height: 5\' 8"  (172.7 cm) Weight: 124.7 kg (275 lb) IBW/kg (Calculated) : 68.4  Temp (24hrs), Avg:99.2 F (37.3 C), Min:98.2 F (36.8 C), Max:99.8 F (37.7 C)  Recent Labs  Lab 04/14/20 0133  WBC 6.7  CREATININE 1.07  LATICACIDVEN 1.1    Estimated Creatinine Clearance: 99.1 mL/min (by C-G formula based on SCr of 1.07 mg/dL).    Allergies  Allergen Reactions  . Penicillins   . Zithromax [Azithromycin]     Microbiology results: 9/13 BCx X 2: collected, results pending  Thank you for allowing pharmacy to be a part of this patient's care.  10/13, PharmD, BCPS, [facility] Clinical Pharmacist 04/14/2020 5:17 PM

## 2020-04-15 LAB — COMPREHENSIVE METABOLIC PANEL
ALT: 63 U/L — ABNORMAL HIGH (ref 0–44)
AST: 40 U/L (ref 15–41)
Albumin: 3.3 g/dL — ABNORMAL LOW (ref 3.5–5.0)
Alkaline Phosphatase: 39 U/L (ref 38–126)
Anion gap: 12 (ref 5–15)
BUN: 21 mg/dL — ABNORMAL HIGH (ref 6–20)
CO2: 25 mmol/L (ref 22–32)
Calcium: 8.7 mg/dL — ABNORMAL LOW (ref 8.9–10.3)
Chloride: 96 mmol/L — ABNORMAL LOW (ref 98–111)
Creatinine, Ser: 0.87 mg/dL (ref 0.61–1.24)
GFR calc Af Amer: >60 mL/min (ref >60)
GFR calc non Af Amer: >60 mL/min (ref >60)
Glucose, Bld: 159 mg/dL — ABNORMAL HIGH (ref 70–99)
Potassium: 3.8 mmol/L (ref 3.5–5.1)
Sodium: 133 mmol/L — ABNORMAL LOW (ref 135–145)
Total Bilirubin: 1 mg/dL (ref 0.3–1.2)
Total Protein: 6.9 g/dL (ref 6.5–8.1)

## 2020-04-15 LAB — D-DIMER, QUANTITATIVE: D-Dimer, Quant: 0.4 ug/mL-FEU (ref 0.00–0.50)

## 2020-04-15 LAB — CBC WITH DIFFERENTIAL/PLATELET
Abs Immature Granulocytes: 0.04 10*3/uL (ref 0.00–0.07)
Basophils Absolute: 0 10*3/uL (ref 0.0–0.1)
Basophils Relative: 0 %
Eosinophils Absolute: 0 10*3/uL (ref 0.0–0.5)
Eosinophils Relative: 0 %
HCT: 46.1 % (ref 39.0–52.0)
Hemoglobin: 15.4 g/dL (ref 13.0–17.0)
Immature Granulocytes: 1 %
Lymphocytes Relative: 9 %
Lymphs Abs: 0.5 10*3/uL — ABNORMAL LOW (ref 0.7–4.0)
MCH: 27.7 pg (ref 26.0–34.0)
MCHC: 33.4 g/dL (ref 30.0–36.0)
MCV: 82.9 fL (ref 80.0–100.0)
Monocytes Absolute: 0.3 10*3/uL (ref 0.1–1.0)
Monocytes Relative: 5 %
Neutro Abs: 5 10*3/uL (ref 1.7–7.7)
Neutrophils Relative %: 85 %
Platelets: 295 10*3/uL (ref 150–400)
RBC: 5.56 MIL/uL (ref 4.22–5.81)
RDW: 13.5 % (ref 11.5–15.5)
WBC: 5.9 10*3/uL (ref 4.0–10.5)
nRBC: 0 % (ref 0.0–0.2)

## 2020-04-15 LAB — PROCALCITONIN: Procalcitonin: <0.1 ng/mL

## 2020-04-15 LAB — HIV ANTIBODY (ROUTINE TESTING W REFLEX): HIV Screen 4th Generation wRfx: NONREACTIVE

## 2020-04-15 LAB — C-REACTIVE PROTEIN: CRP: 6.8 mg/dL — ABNORMAL HIGH (ref <1.0)

## 2020-04-15 MED ORDER — ENSURE ENLIVE PO LIQD
237.0000 mL | Freq: Three times a day (TID) | ORAL | Status: DC
  Administered 2020-04-15 – 2020-04-17 (×5): 237 mL via ORAL

## 2020-04-15 MED ORDER — ORAL CARE MOUTH RINSE
15.0000 mL | Freq: Two times a day (BID) | OROMUCOSAL | Status: DC
  Administered 2020-04-15 – 2020-04-17 (×4): 15 mL via OROMUCOSAL

## 2020-04-15 NOTE — Progress Notes (Signed)
   04/15/20 2002  Assess: MEWS Score  Temp 98.1 F (36.7 C)  BP 122/74  Pulse Rate 79  ECG Heart Rate 79  Resp (!) 25 (RR reassessed)  Level of Consciousness Alert  SpO2 95 %  O2 Device Nasal Cannula  Patient Activity (if Appropriate) In bed  O2 Flow Rate (L/min) 1 L/min  Assess: MEWS Score  MEWS Temp 0  MEWS Systolic 0  MEWS Pulse 0  MEWS RR 1  MEWS LOC 0  MEWS Score 1  MEWS Score Color Green  Assess: if the MEWS score is Yellow or Red  Were vital signs taken at a resting state? Yes  Focused Assessment No change from prior assessment  Early Detection of Sepsis Score *See Row Information* Low  MEWS guidelines implemented *See Row Information* No, other (Comment) (no acute changes)  Treat  Pain Scale 0-10  Pain Score 0  Document  Progress note created (see row info) Yes

## 2020-04-15 NOTE — Plan of Care (Signed)
°  Problem: Education: °Goal: Knowledge of General Education information will improve °Description: Including pain rating scale, medication(s)/side effects and non-pharmacologic comfort measures °Outcome: Progressing °  °Problem: Health Behavior/Discharge Planning: °Goal: Ability to manage health-related needs will improve °Outcome: Progressing °  °Problem: Clinical Measurements: °Goal: Ability to maintain clinical measurements within normal limits will improve °Outcome: Progressing °Goal: Will remain free from infection °Outcome: Progressing °Goal: Diagnostic test results will improve °Outcome: Progressing °Goal: Respiratory complications will improve °Outcome: Progressing °Goal: Cardiovascular complication will be avoided °Outcome: Progressing °  °Problem: Activity: °Goal: Risk for activity intolerance will decrease °Outcome: Progressing °  °Problem: Nutrition: °Goal: Adequate nutrition will be maintained °Outcome: Progressing °  °Problem: Coping: °Goal: Level of anxiety will decrease °Outcome: Progressing °  °Problem: Elimination: °Goal: Will not experience complications related to bowel motility °Outcome: Progressing °Goal: Will not experience complications related to urinary retention °Outcome: Progressing °  °Problem: Pain Managment: °Goal: General experience of comfort will improve °Outcome: Progressing °  °Problem: Safety: °Goal: Ability to remain free from injury will improve °Outcome: Progressing °  °Problem: Skin Integrity: °Goal: Risk for impaired skin integrity will decrease °Outcome: Progressing °  °Problem: Education: °Goal: Knowledge of risk factors and measures for prevention of condition will improve °Outcome: Progressing °  °Problem: Coping: °Goal: Psychosocial and spiritual needs will be supported °Outcome: Progressing °  °Problem: Respiratory: °Goal: Will maintain a patent airway °Outcome: Progressing °Goal: Complications related to the disease process, condition or treatment will be avoided or  minimized °Outcome: Progressing °  °Problem: Education: °Goal: Knowledge of risk factors and measures for prevention of condition will improve °Outcome: Progressing °  °Problem: Coping: °Goal: Psychosocial and spiritual needs will be supported °Outcome: Progressing °  °Problem: Respiratory: °Goal: Will maintain a patent airway °Outcome: Progressing °Goal: Complications related to the disease process, condition or treatment will be avoided or minimized °Outcome: Progressing °  °

## 2020-04-15 NOTE — Progress Notes (Signed)
SATURATION QUALIFICATIONS: (This note is used to comply with regulatory documentation for home oxygen)  Patient Saturations on Room Air at Rest = 93%  Patient Saturations on Room Air while Ambulating = 87%  Patient Saturations on 2 Liters of oxygen while Ambulating = 95%  Please briefly explain why patient needs home oxygen: pt dropped bellow 88% while ambulating

## 2020-04-15 NOTE — Progress Notes (Signed)
RA trialed for patient. SpO2 dropped as low as 82% transferring from chair to bed on RA. SpO2 quickly increased to 90% on RA while resting in bed with HOB elevated. SpO2 86-90% on RA while resting in bed. O2 1L [place] re-applied. Will continue to monitor.

## 2020-04-15 NOTE — TOC Initial Note (Signed)
Transition of Care [facility]) - Initial/Assessment Note    Patient Details  Name: Sergio Mcdonald MRN: [id number] Date of Birth: 07-23-1966  Transition of Care [facility]) CM/SW Contact:    Lockie Pares, RN Phone Number: 04/15/2020, 8:37 AM  Clinical Narrative:                 Patient admitted with COVID . O2 on at 1L currently. Mostly SHOB and desaturating with movement, walking. May need DME oxygen for home. CM will foallow, will make appointment with COVID clinic, patient needs PCP.   Expected Discharge Plan: Home/Self Care Barriers to Discharge: Continued Medical Work up   Patient Goals and CMS Choice        Expected Discharge Plan and Services Expected Discharge Plan: Home/Self Care       Living arrangements for the past 2 months: Single Family Home                                      Prior Living Arrangements/Services Living arrangements for the past 2 months: Single Family Home Lives with:: Spouse Patient language and need for interpreter reviewed:: Yes        Need for Family Participation in Patient Care: Yes (Comment) Care giver support system in place?: Yes (comment)   Criminal Activity/Legal Involvement Pertinent to Current Situation/Hospitalization: No - Comment as needed  Activities of Daily Living Home Assistive Devices/Equipment: None ADL Screening (condition at time of admission) Patient's cognitive ability adequate to safely complete daily activities?: Yes Is the patient deaf or have difficulty hearing?: No Does the patient have difficulty seeing, even when wearing glasses/contacts?: No Does the patient have difficulty concentrating, remembering, or making decisions?: No Patient able to express need for assistance with ADLs?: No Does the patient have difficulty dressing or bathing?: No Independently performs ADLs?: Yes (appropriate for developmental age) Does the patient have difficulty walking or climbing stairs?: No Weakness of Legs:  None Weakness of Arms/Hands: None  Permission Sought/Granted                  Emotional Assessment       Orientation: : Oriented to Self, Oriented to Place, Oriented to  Time, Oriented to Situation Alcohol / Substance Use: Not Applicable Psych Involvement: No (comment)  Admission diagnosis:  Shortness of breath [R06.02] Hypoxia [R09.02] COVID-19 virus infection [U07.1] COVID-19 [U07.1] Patient Active Problem List   Diagnosis Date Noted  . COVID-19 virus infection 04/14/2020  . Essential hypertension 04/14/2020  . Obesity 04/14/2020   PCP:  Patient, No Pcp Per Pharmacy:   [facility] [address] - [place], [place] - [address] [place] [address] Phone: [phone] Fax: [phone]     Social Determinants of Health (SDOH) Interventions    Readmission Risk Interventions No flowsheet data found.

## 2020-04-15 NOTE — Progress Notes (Signed)
Patient was in chair for approximately 2.5 hours this shift. Patient transferred to bed with standby assist, and remained prone for approximately 3.5 hours. Cardiac monitor repeatedly alarmed d/t leads coming off of chest. Patient allowed NT to shave chest, but wanted to sit up in chair. Patient has been up in chair for approximately 4 hours. SpO2 has remained 98-100% on O2 2L [place]. This RN titrating O2 down to 1L [place]. Patient remains in chair with no distress noted. Will continue to monitor.

## 2020-04-15 NOTE — Progress Notes (Signed)
PROGRESS NOTE                                                                                                                                                                                                             Patient Demographics:    Sergio Mcdonald, is a 54 y.o. male, DOB - 11-06-1965, NAT:[id number]  Admit date - 04/14/2020   Admitting Physician Starleen Arms, MD  Outpatient Primary MD for the patient is Patient, No Pcp Per  LOS - 1   Chief Complaint  Patient presents with  . Shortness of Breath    + covid       Brief Narrative    Sergio Mcdonald  is a 54 y.o. male, with past medical history of hypertension, obesity, patient started with COVID-19 symptoms 9/3, he tested positive on 9/7, patient did receive monoclonal antibody 9/10 at [facility], patient does report developing dyspnea over last 2 days, report is mainly exertional, as well he does report cough, nonproductive, he does report intermittent fever and chills, he does report generalized weakness, fatigue, but he denies nausea, vomiting, diarrhea, abdominal pain, patient is unvaccinated against Covid, as well reports his wife has tested positive as well, she is vaccinated, she has no symptoms. -Patient presents to [facility] with dyspnea, chest x-ray significant for bibasilar opacities, T-max 99.8, CRP elevated at 7.7, ferritin elevated at 1.8, his LFTs mildly elevated with AST at 43, ALT at 57, patient was started on IV steroids, remdesivir.    Subjective:    Sergio Mcdonald today reports significant dyspnea with activity, still reports some cough, denies nausea, vomiting or diarrhea.    Assessment  & Plan :    Active Problems:   COVID-19 virus infection   Essential hypertension   Obesity    Acute hypoxic respiratory failure due to COVID-19 pneumonia -Patient is unvaccinated  - chest x-ray significant for bibasilar multifocal opacity. -Patient hypoxic today 84% on 1 L oxygen  during walking in the hallway, but he recovered with rest and increasing his oxygen to 2 L, but at rest he is tolerating 1 L via nasal cannula . -Discussed with patient, encouraged use incentive spirometry, flutter valve, and out of bed to chair, and proning as tolerated . -He did receive monoclonal antibodies at [facility] in 9/10. -Continue with IV remdesivir. -Continue with IV steroids -  So far no indication for Actemra/baricitinib. -We will monitor inflammatory markers closely. -Procalcitonin within normal limits, no indications for antibiotics  Recent Labs  Lab 04/14/20 0133 04/15/20 0659  WBC 6.7 5.9  CRP 7.7* 6.8*  DDIMER 0.75* 0.40  PROCALCITON 0.14 <0.10  LATICACIDVEN 1.1  --   AST 43* 40  ALT 57* 63*  ALKPHOS 33* 39  BILITOT 0.7 1.0  ALBUMIN 3.6 3.3*        Hypertension -Stable on home medications.  Left eye abrasion -During recent ED visit 8/29, continue home eyedrops  Transaminitis -Most likely related to Covid, mild, will monitor  Obesity with BMI of 41.8 -Weight loss counseling as an outpatient once stable.   COVID-19 Labs  Recent Labs    04/14/20 0133 04/15/20 0659  DDIMER 0.75* 0.40  FERRITIN 1,886*  --   LDH 354*  --   CRP 7.7* 6.8*    No results found for: SARSCOV2NAA   Code Status : Full  Family Communication  : D/W wife 9/14  Disposition Plan  :  Status is: Inpatient  Remains inpatient appropriate because:IV treatments appropriate due to intensity of illness or inability to take PO   Dispo: The patient is from: Home              Anticipated d/c is to: Home              Anticipated d/c date is: 3 days              Patient currently is medically stable to d/c.   Consults  :  none  Procedures  : none  DVT Prophylaxis  :  [place] lovenox  Lab Results  Component Value Date   PLT 295 04/15/2020    Antibiotics  :    Anti-infectives (From admission, onward)   Start     Dose/Rate Route Frequency Ordered Stop   04/15/20  1000  remdesivir 100 mg in sodium chloride 0.9 % 100 mL IVPB  Status:  Discontinued        100 mg 200 mL/hr over 30 Minutes Intravenous Daily 04/14/20 0426 04/14/20 1223   04/15/20 1000  remdesivir 100 mg in sodium chloride 0.9 % 100 mL IVPB        100 mg 200 mL/hr over 30 Minutes Intravenous Daily 04/14/20 1223 04/19/20 0959   04/14/20 0430  remdesivir 100 mg in sodium chloride 0.9 % 100 mL IVPB        100 mg 200 mL/hr over 30 Minutes Intravenous Every 30 min 04/14/20 0426 04/14/20 0549        Objective:   Vitals:   04/15/20 0610 04/15/20 0641 04/15/20 0647 04/15/20 0816  BP:    137/87  Pulse: 68 76 79 84  Resp: (!) 22   19  Temp:    98.6 F (37 C)  TempSrc:    Oral  SpO2: 100% (!) 86% 95% 92%  Weight:      Height:        Wt Readings from Last 3 Encounters:  04/14/20 124.7 kg  03/30/20 127 kg     Intake/Output Summary (Last 24 hours) at 04/15/2020 1140 Last data filed at 04/15/2020 0700 Gross per 24 hour  Intake 120 ml  Output 1025 ml  Net -905 ml     Physical Exam  Awake Alert, Oriented X 3, No new F.N deficits, Normal affect Symmetrical Chest wall movement, Good air movement bilaterally, CTAB RRR,No Gallops,Rubs or new Murmurs, No Parasternal Heave +ve B.Sounds,  Abd Soft, No tenderness No rebound - guarding or rigidity. No Cyanosis, Clubbing or edema, No new Rash or bruise      Data Review:    CBC Recent Labs  Lab 04/14/20 0133 04/15/20 0659  WBC 6.7 5.9  HGB 14.5 15.4  HCT 43.2 46.1  PLT 229 295  MCV 82.0 82.9  MCH 27.5 27.7  MCHC 33.6 33.4  RDW 13.2 13.5  LYMPHSABS 0.5* 0.5*  MONOABS 0.3 0.3  EOSABS 0.0 0.0  BASOSABS 0.0 0.0    Chemistries  Recent Labs  Lab 04/14/20 0133 04/15/20 0659  NA 132* 133*  K 3.6 3.8  CL 94* 96*  CO2 25 25  GLUCOSE 126* 159*  BUN 19 21*  CREATININE 1.07 0.87  CALCIUM 8.2* 8.7*  AST 43* 40  ALT 57* 63*  ALKPHOS 33* 39  BILITOT 0.7 1.0    ------------------------------------------------------------------------------------------------------------------ Recent Labs    04/14/20 0133  TRIG 95    No results found for: HGBA1C ------------------------------------------------------------------------------------------------------------------ No results for input(s): TSH, T4TOTAL, T3FREE, THYROIDAB in the last 72 hours.  Invalid input(s): FREET3 ------------------------------------------------------------------------------------------------------------------ Recent Labs    04/14/20 0133  FERRITIN 1,886*    Coagulation profile No results for input(s): INR, PROTIME in the last 168 hours.  Recent Labs    04/14/20 0133 04/15/20 0659  DDIMER 0.75* 0.40    Cardiac Enzymes No results for input(s): CKMB, TROPONINI, MYOGLOBIN in the last 168 hours.  Invalid input(s): CK ------------------------------------------------------------------------------------------------------------------ No results found for: BNP  Inpatient Medications  Scheduled Meds: . enoxaparin (LOVENOX) injection  60 mg Subcutaneous Q24H  . lisinopril  40 mg Oral Daily  . mouth rinse  15 mL Mouth Rinse BID  . methylPREDNISolone (SOLU-MEDROL) injection  80 mg Intravenous Q12H   Followed by  . [START ON 04/17/2020] predniSONE  50 mg Oral Daily  . pantoprazole  40 mg Oral Daily  . tobramycin-dexamethasone  1 drop Left Eye Q4H while awake   Continuous Infusions: . sodium chloride Stopped (04/14/20 1800)  . remdesivir 100 mg in NS 100 mL 100 mg (04/15/20 0840)   PRN Meds:.sodium chloride, chlorpheniramine-HYDROcodone, guaiFENesin-dextromethorphan  Micro Results Recent Results (from the past 240 hour(s))  Blood Culture (routine x 2)     Status: None (Preliminary result)   Collection Time: 04/14/20  1:30 AM   Specimen: BLOOD  Result Value Ref Range Status   Specimen Description   Final    BLOOD RIGHT ANTECUBITAL Performed at [facility], [address] [place], [place] [address]    Special Requests   Final    BOTTLES DRAWN AEROBIC AND ANAEROBIC Blood Culture adequate volume Performed at [facility], [address], [place], [place] [address]    Culture   Final    NO GROWTH 1 DAY Performed at [facility], [address], [place], [place] [address]    Report Status PENDING  Incomplete  Blood Culture (routine x 2)     Status: None (Preliminary result)   Collection Time: 04/14/20  1:40 AM   Specimen: BLOOD LEFT FOREARM  Result Value Ref Range Status   Specimen Description   Final    BLOOD LEFT FOREARM Performed at [facility], [address] [place], [place] [address]    Special Requests   Final    BOTTLES DRAWN AEROBIC AND ANAEROBIC Blood Culture adequate volume Performed at [facility], [address], [place], [place] [address]  Culture   Final    NO GROWTH 1 DAY Performed at [facility], [address] [place], [place] [address]    Report Status PENDING  Incomplete    Radiology Reports DG Chest Port 1 View  Result Date: 04/14/2020 CLINICAL DATA:  COVID positive with symptoms since September 3rd. EXAM: PORTABLE CHEST 1 VIEW COMPARISON:  August 22, 2018 FINDINGS: Mildly decreased lung volumes are seen which is likely, in part, secondary to suboptimal patient inspiration. Mild ill-defined areas of atelectasis and/or early infiltrate are noted within the bilateral lung bases. There is no evidence of a pleural effusion or pneumothorax. The cardiac silhouette is mildly enlarged and unchanged in size. The visualized skeletal structures are unremarkable. IMPRESSION: 1. Mild ill-defined bibasilar atelectasis and/or early infiltrate. Electronically Signed   By: Aram Candelahaddeus  Houston M.D.   On: 04/14/2020 01:29     Huey Bienenstockawood Milania Haubner M.D on 04/15/2020 at 11:40 AM    [facility] -  Office  [phone]

## 2020-04-16 DIAGNOSIS — R0902 Hypoxemia: Secondary | ICD-10-CM

## 2020-04-16 DIAGNOSIS — E669 Obesity, unspecified: Secondary | ICD-10-CM

## 2020-04-16 LAB — CBC WITH DIFFERENTIAL/PLATELET
Abs Immature Granulocytes: 0.07 10*3/uL (ref 0.00–0.07)
Basophils Absolute: 0 10*3/uL (ref 0.0–0.1)
Basophils Relative: 0 %
Eosinophils Absolute: 0 10*3/uL (ref 0.0–0.5)
Eosinophils Relative: 0 %
HCT: 45.8 % (ref 39.0–52.0)
Hemoglobin: 15.1 g/dL (ref 13.0–17.0)
Immature Granulocytes: 1 %
Lymphocytes Relative: 6 %
Lymphs Abs: 0.4 10*3/uL — ABNORMAL LOW (ref 0.7–4.0)
MCH: 27.2 pg (ref 26.0–34.0)
MCHC: 33 g/dL (ref 30.0–36.0)
MCV: 82.4 fL (ref 80.0–100.0)
Monocytes Absolute: 0.3 10*3/uL (ref 0.1–1.0)
Monocytes Relative: 4 %
Neutro Abs: 5.9 10*3/uL (ref 1.7–7.7)
Neutrophils Relative %: 89 %
Platelets: 346 10*3/uL (ref 150–400)
RBC: 5.56 MIL/uL (ref 4.22–5.81)
RDW: 13.2 % (ref 11.5–15.5)
WBC: 6.7 10*3/uL (ref 4.0–10.5)
nRBC: 0 % (ref 0.0–0.2)

## 2020-04-16 LAB — C-REACTIVE PROTEIN: CRP: 1.8 mg/dL — ABNORMAL HIGH (ref <1.0)

## 2020-04-16 LAB — COMPREHENSIVE METABOLIC PANEL
ALT: 109 U/L — ABNORMAL HIGH (ref 0–44)
AST: 70 U/L — ABNORMAL HIGH (ref 15–41)
Albumin: 3.3 g/dL — ABNORMAL LOW (ref 3.5–5.0)
Alkaline Phosphatase: 34 U/L — ABNORMAL LOW (ref 38–126)
Anion gap: 10 (ref 5–15)
BUN: 23 mg/dL — ABNORMAL HIGH (ref 6–20)
CO2: 27 mmol/L (ref 22–32)
Calcium: 8.8 mg/dL — ABNORMAL LOW (ref 8.9–10.3)
Chloride: 98 mmol/L (ref 98–111)
Creatinine, Ser: 0.76 mg/dL (ref 0.61–1.24)
GFR calc Af Amer: >60 mL/min (ref >60)
GFR calc non Af Amer: >60 mL/min (ref >60)
Glucose, Bld: 183 mg/dL — ABNORMAL HIGH (ref 70–99)
Potassium: 3.9 mmol/L (ref 3.5–5.1)
Sodium: 135 mmol/L (ref 135–145)
Total Bilirubin: 1.1 mg/dL (ref 0.3–1.2)
Total Protein: 6.4 g/dL — ABNORMAL LOW (ref 6.5–8.1)

## 2020-04-16 LAB — PROCALCITONIN: Procalcitonin: <0.1 ng/mL

## 2020-04-16 LAB — D-DIMER, QUANTITATIVE: D-Dimer, Quant: 0.3 ug/mL-FEU (ref 0.00–0.50)

## 2020-04-16 NOTE — Progress Notes (Signed)
SATURATION QUALIFICATIONS: (This note is used to comply with regulatory documentation for home oxygen)  Patient Saturations on Room Air at Rest = 96%  Patient Saturations on Room Air while Ambulating = 92%  Please briefly explain why patient needs home oxygen: Pt does not meet qualifications for home O2.

## 2020-04-16 NOTE — Progress Notes (Addendum)
PROGRESS NOTE                                                                                                                                                                                                             Patient Demographics:    Sergio Mcdonald, is a 54 y.o. male, DOB - 08/03/1965, ZOX:[id number]  Outpatient Primary MD for the patient is Patient, No Pcp Per   Admit date - 04/14/2020   LOS - 2  Chief Complaint  Patient presents with  . Shortness of Breath    + covid       Brief Narrative: Patient is a 54 y.o. male with PMHx of HTN-who tested positive for COVID-19 on 9/7-subsequently received monoclonal antibody on 9/10 at [facility]-presented on 9/13 with shortness of breath-found to have acute hypoxic respiratory failure secondary to COVID-19 pneumonia.  COVID-19 vaccinated status: Unvaccinated  Significant Events: 9/7>> COVID-19 positive 9/13>> Ad[facility] for hypoxia due to COVID-19 pneumonia  Significant studies: 9/13>>Chest x-ray: Mild ill-defined bibasilar atelectasis/early infiltrate.  COVID-19 medications: Steroids: 9/13>> Remdesivir: 9/13>>  Antibiotics: None  Microbiology data: 9/13>> blood culture: No growth  Procedures: None  Consults: None  DVT prophylaxis: Lovenox at prophylactic dosing.    Subjective:    Sergio BihariJames Reidinger today is overall better-lying comfortably at bedside chair-titrated off to room air today.  He subsequently ambulated in the hallway with mild shortness of breath but otherwise no major oxygen requirement.   Assessment  & Plan :   Acute Hypoxic Resp Failure due to Covid 19 Viral pneumonia: Improved-titrated to room air today-remains on steroids and Remdesivir.  Ambulated by RN staff in the hallway-he continues to have some amount of shortness of breath.  Plans are to continue steroids/remdesivir-and reassess for discharge tomorrow.  Fever: afebrile O2  requirements:  SpO2: 97 % O2 Flow Rate (L/min): 1 L/min   COVID-19 Labs: Recent Labs    04/14/20 0133 04/15/20 0659 04/16/20 0910  DDIMER 0.75* 0.40 0.30  FERRITIN 1,886*  --   --   LDH 354*  --   --   CRP 7.7* 6.8* 1.8*    No results found for: BNP  Recent Labs  Lab 04/14/20 0133 04/15/20 0659 04/16/20 0910  PROCALCITON 0.14 <0.10 <0.10    No results found for: SARSCOV2NAA   Prone/Incentive Spirometry: encouraged incentive spirometry use  3-4/hour.  Transaminitis: Mild-continue to monitor closely.  Etiology felt secondary to COVID-19.  HTN: Stable-continue lisinopril.  Left eye abrasion: Continue eyedrops-occurred during recent ED visit of 8/29.  Morbid Obesity: Estimated body mass index is 41.81 kg/m as calculated from the following:   Height as of this encounter: 5\' 8"  (1.727 m).   Weight as of this encounter: 124.7 kg.    ABG: No results found for: PHART, PCO2ART, PO2ART, HCO3, TCO2, ACIDBASEDEF, O2SAT  Vent Settings: N/A   Condition - Stable  Family Communication  : Spouse (Heidi) updated over the phone 9/15  Code Status :  Full Code  Diet :  Diet Order            Diet Heart Room service appropriate? Yes; Fluid consistency: Thin  Diet effective now                  Disposition Plan  :   Status is: Inpatient  Remains inpatient appropriate because:Inpatient level of care appropriate due to severity of illness   Dispo: The patient is from: Home              Anticipated d/c is to: Home              Anticipated d/c date is: 1 day              Patient currently is not medically stable to d/c.  Barriers to discharge: Hypoxia requiring O2 supplementation/complete 5 days of IV Remdesivir  Antimicorbials  :    Anti-infectives (From admission, onward)   Start     Dose/Rate Route Frequency Ordered Stop   04/15/20 1000  remdesivir 100 mg in sodium chloride 0.9 % 100 mL IVPB  Status:  Discontinued        100 mg 200 mL/hr over 30 Minutes  Intravenous Daily 04/14/20 0426 04/14/20 1223   04/15/20 1000  remdesivir 100 mg in sodium chloride 0.9 % 100 mL IVPB        100 mg 200 mL/hr over 30 Minutes Intravenous Daily 04/14/20 1223 04/19/20 0959   04/14/20 0430  remdesivir 100 mg in sodium chloride 0.9 % 100 mL IVPB        100 mg 200 mL/hr over 30 Minutes Intravenous Every 30 min 04/14/20 0426 04/14/20 0549      Inpatient Medications  Scheduled Meds: . enoxaparin (LOVENOX) injection  60 mg Subcutaneous Q24H  . feeding supplement (ENSURE ENLIVE)  237 mL Oral TID BM  . lisinopril  40 mg Oral Daily  . mouth rinse  15 mL Mouth Rinse BID  . methylPREDNISolone (SOLU-MEDROL) injection  80 mg Intravenous Q12H   Followed by  . [START ON 04/17/2020] predniSONE  50 mg Oral Daily  . pantoprazole  40 mg Oral Daily  . tobramycin-dexamethasone  1 drop Left Eye Q4H while awake   Continuous Infusions: . sodium chloride Stopped (04/14/20 1800)  . remdesivir 100 mg in NS 100 mL 100 mg (04/16/20 0845)   PRN Meds:.sodium chloride, chlorpheniramine-HYDROcodone, guaiFENesin-dextromethorphan   Time Spent in minutes  25  See all Orders from today for further details   04/18/20 M.D on 04/16/2020 at 2:44 PM  To page go to [email or web address] - use universal password  [facility] -  Office  [phone]    Objective:   Vitals:   04/16/20 0004 04/16/20 0423 04/16/20 0746 04/16/20 0802  BP: 123/85 118/77  123/83  Pulse: 84 76    Resp: (!) 22 (!) 21  19  Temp: 98.4 F (36.9 C) 98.5 F (36.9 C) 97.9 F (36.6 C) 97.9 F (36.6 C)  TempSrc: Oral Oral Oral Oral  SpO2: 94% 97%    Weight:      Height:        Wt Readings from Last 3 Encounters:  04/14/20 124.7 kg  03/30/20 127 kg     Intake/Output Summary (Last 24 hours) at 04/16/2020 1444 Last data filed at 04/16/2020 3559 Gross per 24 hour  Intake 1060.06 ml  Output 1160 ml  Net -99.94 ml     Physical Exam Gen Exam:Alert awake-not in any distress HEENT:atraumatic,  normocephalic Chest: B/L clear to auscultation anteriorly CVS:S1S2 regular Abdomen:soft non tender, non distended Extremities:no edema Neurology: Non focal Skin: no rash   Data Review:    CBC Recent Labs  Lab 04/14/20 0133 04/15/20 0659 04/16/20 0910  WBC 6.7 5.9 6.7  HGB 14.5 15.4 15.1  HCT 43.2 46.1 45.8  PLT 229 295 346  MCV 82.0 82.9 82.4  MCH 27.5 27.7 27.2  MCHC 33.6 33.4 33.0  RDW 13.2 13.5 13.2  LYMPHSABS 0.5* 0.5* 0.4*  MONOABS 0.3 0.3 0.3  EOSABS 0.0 0.0 0.0  BASOSABS 0.0 0.0 0.0    Chemistries  Recent Labs  Lab 04/14/20 0133 04/15/20 0659 04/16/20 0910  NA 132* 133* 135  K 3.6 3.8 3.9  CL 94* 96* 98  CO2 25 25 27   GLUCOSE 126* 159* 183*  BUN 19 21* 23*  CREATININE 1.07 0.87 0.76  CALCIUM 8.2* 8.7* 8.8*  AST 43* 40 70*  ALT 57* 63* 109*  ALKPHOS 33* 39 34*  BILITOT 0.7 1.0 1.1   ------------------------------------------------------------------------------------------------------------------ Recent Labs    04/14/20 0133  TRIG 95    No results found for: HGBA1C ------------------------------------------------------------------------------------------------------------------ No results for input(s): TSH, T4TOTAL, T3FREE, THYROIDAB in the last 72 hours.  Invalid input(s): FREET3 ------------------------------------------------------------------------------------------------------------------ Recent Labs    04/14/20 0133  FERRITIN 1,886*    Coagulation profile No results for input(s): INR, PROTIME in the last 168 hours.  Recent Labs    04/15/20 0659 04/16/20 0910  DDIMER 0.40 0.30    Cardiac Enzymes No results for input(s): CKMB, TROPONINI, MYOGLOBIN in the last 168 hours.  Invalid input(s): CK ------------------------------------------------------------------------------------------------------------------ No results found for: BNP  Micro Results Recent Results (from the past 240 hour(s))  Blood Culture (routine x 2)      Status: None (Preliminary result)   Collection Time: 04/14/20  1:30 AM   Specimen: BLOOD  Result Value Ref Range Status   Specimen Description   Final    BLOOD RIGHT ANTECUBITAL Performed at [facility], [address] [place], [place]    Special Requests   Final    BOTTLES DRAWN AEROBIC AND ANAEROBIC Blood Culture adequate volume Performed at [facility], [address] [place], [place]    Culture   Final    NO GROWTH 2 DAYS Performed at [facility], [address], [place], [place]    Report Status PENDING  Incomplete  Blood Culture (routine x 2)     Status: None (Preliminary result)   Collection Time: 04/14/20  1:40 AM   Specimen: BLOOD LEFT FOREARM  Result Value Ref Range Status   Specimen Description   Final    BLOOD LEFT FOREARM Performed at [facility], [address], [place], [place]    Special Requests   Final    BOTTLES  DRAWN AEROBIC AND ANAEROBIC Blood Culture adequate volume Performed at [facility], [address] [place], [place] [address]    Culture   Final    NO GROWTH 2 DAYS Performed at [facility], [address], [place], [place] [address]    Report Status PENDING  Incomplete    Radiology Reports DG Chest Port 1 View  Result Date: 04/14/2020 CLINICAL DATA:  COVID positive with symptoms since September 3rd. EXAM: PORTABLE CHEST 1 VIEW COMPARISON:  August 22, 2018 FINDINGS: Mildly decreased lung volumes are seen which is likely, in part, secondary to suboptimal patient inspiration. Mild ill-defined areas of atelectasis and/or early infiltrate are noted within the bilateral lung bases. There is no evidence of a pleural effusion or pneumothorax. The cardiac silhouette is mildly enlarged and unchanged in size. The visualized skeletal structures are unremarkable. IMPRESSION: 1. Mild ill-defined bibasilar atelectasis and/or early infiltrate. Electronically  Signed   By: Aram Candela M.D.   On: 04/14/2020 01:29

## 2020-04-17 LAB — CBC WITH DIFFERENTIAL/PLATELET
Abs Immature Granulocytes: 0.11 10*3/uL — ABNORMAL HIGH (ref 0.00–0.07)
Basophils Absolute: 0 10*3/uL (ref 0.0–0.1)
Basophils Relative: 0 %
Eosinophils Absolute: 0 10*3/uL (ref 0.0–0.5)
Eosinophils Relative: 0 %
HCT: 46.8 % (ref 39.0–52.0)
Hemoglobin: 15.7 g/dL (ref 13.0–17.0)
Immature Granulocytes: 1 %
Lymphocytes Relative: 7 %
Lymphs Abs: 0.6 10*3/uL — ABNORMAL LOW (ref 0.7–4.0)
MCH: 28 pg (ref 26.0–34.0)
MCHC: 33.5 g/dL (ref 30.0–36.0)
MCV: 83.4 fL (ref 80.0–100.0)
Monocytes Absolute: 0.5 10*3/uL (ref 0.1–1.0)
Monocytes Relative: 6 %
Neutro Abs: 7.2 10*3/uL (ref 1.7–7.7)
Neutrophils Relative %: 86 %
Platelets: 411 10*3/uL — ABNORMAL HIGH (ref 150–400)
RBC: 5.61 MIL/uL (ref 4.22–5.81)
RDW: 13.2 % (ref 11.5–15.5)
WBC: 8.4 10*3/uL (ref 4.0–10.5)
nRBC: 0 % (ref 0.0–0.2)

## 2020-04-17 LAB — COMPREHENSIVE METABOLIC PANEL
ALT: 157 U/L — ABNORMAL HIGH (ref 0–44)
AST: 63 U/L — ABNORMAL HIGH (ref 15–41)
Albumin: 3.4 g/dL — ABNORMAL LOW (ref 3.5–5.0)
Alkaline Phosphatase: 38 U/L (ref 38–126)
Anion gap: 11 (ref 5–15)
BUN: 22 mg/dL — ABNORMAL HIGH (ref 6–20)
CO2: 26 mmol/L (ref 22–32)
Calcium: 9.1 mg/dL (ref 8.9–10.3)
Chloride: 99 mmol/L (ref 98–111)
Creatinine, Ser: 0.82 mg/dL (ref 0.61–1.24)
GFR calc Af Amer: >60 mL/min (ref >60)
GFR calc non Af Amer: >60 mL/min (ref >60)
Glucose, Bld: 124 mg/dL — ABNORMAL HIGH (ref 70–99)
Potassium: 4.5 mmol/L (ref 3.5–5.1)
Sodium: 136 mmol/L (ref 135–145)
Total Bilirubin: 1 mg/dL (ref 0.3–1.2)
Total Protein: 6.6 g/dL (ref 6.5–8.1)

## 2020-04-17 LAB — C-REACTIVE PROTEIN: CRP: 0.9 mg/dL (ref <1.0)

## 2020-04-17 LAB — D-DIMER, QUANTITATIVE: D-Dimer, Quant: 0.31 ug/mL-FEU (ref 0.00–0.50)

## 2020-04-17 MED ORDER — ALBUTEROL SULFATE HFA 108 (90 BASE) MCG/ACT IN AERS: 2.0000 | INHALATION_SPRAY | Freq: Four times a day (QID) | RESPIRATORY_TRACT | 0 refills | Status: AC | PRN

## 2020-04-17 MED ORDER — BENZONATATE 100 MG PO CAPS: 100.0000 mg | ORAL_CAPSULE | Freq: Four times a day (QID) | ORAL | 0 refills | Status: AC | PRN

## 2020-04-17 MED ORDER — PREDNISONE 10 MG PO TABS: ORAL_TABLET | ORAL | 0 refills | Status: DC

## 2020-04-17 MED ORDER — ENSURE ENLIVE PO LIQD: 237.0000 mL | Freq: Three times a day (TID) | ORAL | 0 refills | Status: AC

## 2020-04-17 NOTE — Discharge Summary (Signed)
PATIENT DETAILS Name: Sergio Mcdonald Age: 54 y.o. Sex: male Date of Birth: 12/09/1965 MRN: [id number]. Admitting Physician: Starleen Armsawood S Elgergawy, MD WUJ:[id number]:Patient, No Pcp Per  Admit Date: 04/14/2020 Discharge date: 04/17/2020  Recommendations for Outpatient Follow-up:  1. Follow up with PCP in 1-2 weeks 2. Please obtain CMP/CBC in one week 3. Repeat Chest Xray in 4-6 week  Admitted From:  Home  Disposition: Home   Home Health: No  Equipment/Devices: None  Discharge Condition: Stable  CODE STATUS: FULL CODE  Diet recommendation:  Diet Order            Diet - low sodium heart healthy           Diet Heart Room service appropriate? Yes; Fluid consistency: Thin  Diet effective now                 Brief Narrative: Patient is a 54 y.o. male with PMHx of HTN-who tested positive for COVID-19 on 9/7-subsequently received monoclonal antibody on 9/10 at [facility]-presented on 9/13 with shortness of breath-found to have acute hypoxic respiratory failure secondary to COVID-19 pneumonia.  COVID-19 vaccinated status: Unvaccinated  Significant Events: 9/7>> COVID-19 positive 9/13>> Ad[facility] hypoxia due to COVID-19 pneumonia  Significant studies: 9/13>>Chest x-ray: Mild ill-defined bibasilar atelectasis/early infiltrate.  COVID-19 medications: Steroids: 9/13>> Remdesivir: 9/13>>  Antibiotics: None  Microbiology data: 9/13>> blood culture: No growth  Procedures: None  Consults: None  Brief Hospital Course: Acute Hypoxic Resp Failure due to Covid 19 Viral pneumonia:  Significantly better-titrated to room air yesterday-does not require O2 with ambulation.  Treated with steroids and Remdesivir.  Plans are for discharge today-he will complete Remdesivir at the [facility] infusion center tomorrow.  Will be on tapering steroids for a few more days.  Patient will follow with his primary care practitioner for further continued care.  PCP to repeat two-view  chest x-ray in 4 to 6 weeks.  COVID-19 Labs:  Recent Labs    04/15/20 0659 04/16/20 0910 04/17/20 0513  DDIMER 0.40 0.30 0.31  CRP 6.8* 1.8* 0.9    No results found for: SARSCOV2NAA   Transaminitis: Mild-continue to monitor closely.  Etiology felt secondary to COVID-19.  PCP to repeat LFTs in 1 week.  HTN: Stable-continue lisinopril.  Left eye abrasion: Continue eyedrops as previous-occurred during recent ED visit of 8/29.  Morbid Obesity: Estimated body mass index is 41.81 kg/m as calculated from the following:   Height as of this encounter: 5\' 8"  (1.727 m).   Weight as of this encounter: 124.7 kg.   Discharge Diagnoses:  Active Problems:   COVID-19 virus infection   Essential hypertension   Obesity   Discharge Instructions:    Person Under Monitoring Name: Sergio Mcdonald  [place] [address] [place] [id number]   Infection Prevention Recommendations for Individuals Confirmed to have, or Being Evaluated for, 2019 Novel Coronavirus (COVID-19) Infection Who Receive Care at Home  Individuals who are confirmed to have, or are being evaluated for, COVID-19 should follow the prevention steps below until a healthcare provider or local or state health department says they can return to normal activities.  Stay home except to get medical care You should restrict activities outside your home, except for getting medical care. Do not go to work, school, or public areas, and do not use public transportation or taxis.  Call ahead before visiting your doctor Before your medical appointment, call the healthcare provider and tell them that you have, or are being  evaluated for, COVID-19 infection. This will help the healthcare provider's office take steps to keep other people from getting infected. Ask your healthcare provider to call the local or state health department.  Monitor your symptoms Seek prompt medical attention if your illness is worsening  (e.g., difficulty breathing). Before going to your medical appointment, call the healthcare provider and tell them that you have, or are being evaluated for, COVID-19 infection. Ask your healthcare provider to call the local or state health department.  Wear a facemask You should wear a facemask that covers your nose and mouth when you are in the same room with other people and when you visit a healthcare provider. People who live with or visit you should also wear a facemask while they are in the same room with you.  Separate yourself from other people in your home As much as possible, you should stay in a different room from other people in your home. Also, you should use a separate bathroom, if available.  Avoid sharing household items You should not share dishes, drinking glasses, cups, eating utensils, towels, bedding, or other items with other people in your home. After using these items, you should wash them thoroughly with soap and water.  Cover your coughs and sneezes Cover your mouth and nose with a tissue when you cough or sneeze, or you can cough or sneeze into your sleeve. Throw used tissues in a lined trash can, and immediately wash your hands with soap and water for at least 20 seconds or use an alcohol-based hand rub.  Wash your [employer] your hands often and thoroughly with soap and water for at least 20 seconds. You can use an alcohol-based hand sanitizer if soap and water are not available and if your hands are not visibly dirty. Avoid touching your eyes, nose, and mouth with unwashed hands.   Prevention Steps for Caregivers and Household Members of Individuals Confirmed to have, or Being Evaluated for, COVID-19 Infection Being Cared for in the Home  If you live with, or provide care at home for, a person confirmed to have, or being evaluated for, COVID-19 infection please follow these guidelines to prevent infection:  Follow healthcare provider's  instructions Make sure that you understand and can help the patient follow any healthcare provider instructions for all care.  Provide for the patient's basic needs You should help the patient with basic needs in the home and provide support for getting groceries, prescriptions, and other personal needs.  Monitor the patient's symptoms If they are getting sicker, call his or her medical provider and tell them that the patient has, or is being evaluated for, COVID-19 infection. This will help the healthcare provider's office take steps to keep other people from getting infected. Ask the healthcare provider to call the local or state health department.  Limit the number of people who have contact with the patient  If possible, have only one caregiver for the patient.  Other household members should stay in another home or place of residence. If this is not possible, they should stay  in another room, or be separated from the patient as much as possible. Use a separate bathroom, if available.  Restrict visitors who do not have an essential need to be in the home.  Keep older adults, very young children, and other sick people away from the patient Keep older adults, very young children, and those who have compromised immune systems or chronic health conditions away from the patient. This includes  people with chronic heart, lung, or kidney conditions, diabetes, and cancer.  Ensure good ventilation Make sure that shared spaces in the home have good air flow, such as from an air conditioner or an opened window, weather permitting.  Wash your hands often  Wash your hands often and thoroughly with soap and water for at least 20 seconds. You can use an alcohol based hand sanitizer if soap and water are not available and if your hands are not visibly dirty.  Avoid touching your eyes, nose, and mouth with unwashed hands.  Use disposable paper towels to dry your hands. If not available, use  dedicated cloth towels and replace them when they become wet.  Wear a facemask and gloves  Wear a disposable facemask at all times in the room and gloves when you touch or have contact with the patient's blood, body fluids, and/or secretions or excretions, such as sweat, saliva, sputum, nasal mucus, vomit, urine, or feces.  Ensure the mask fits over your nose and mouth tightly, and do not touch it during use.  Throw out disposable facemasks and gloves after using them. Do not reuse.  Wash your hands immediately after removing your facemask and gloves.  If your personal clothing becomes contaminated, carefully remove clothing and launder. Wash your hands after handling contaminated clothing.  Place all used disposable facemasks, gloves, and other waste in a lined container before disposing them with other household waste.  Remove gloves and wash your hands immediately after handling these items.  Do not share dishes, glasses, or other household items with the patient  Avoid sharing household items. You should not share dishes, drinking glasses, cups, eating utensils, towels, bedding, or other items with a patient who is confirmed to have, or being evaluated for, COVID-19 infection.  After the person uses these items, you should wash them thoroughly with soap and water.  Wash laundry thoroughly  Immediately remove and wash clothes or bedding that have blood, body fluids, and/or secretions or excretions, such as sweat, saliva, sputum, nasal mucus, vomit, urine, or feces, on them.  Wear gloves when handling laundry from the patient.  Read and follow directions on labels of laundry or clothing items and detergent. In general, wash and dry with the warmest temperatures recommended on the label.  Clean all areas the individual has used often  Clean all touchable surfaces, such as counters, tabletops, doorknobs, bathroom fixtures, toilets, phones, keyboards, tablets, and bedside tables, every  day. Also, clean any surfaces that may have blood, body fluids, and/or secretions or excretions on them.  Wear gloves when cleaning surfaces the patient has come in contact with.  Use a diluted bleach solution (e.g., dilute bleach with 1 part bleach and 10 parts water) or a household disinfectant with a label that says EPA-registered for coronaviruses. To make a bleach solution at home, add 1 tablespoon of bleach to 1 quart (4 cups) of water. For a larger supply, add  cup of bleach to 1 gallon (16 cups) of water.  Read labels of cleaning products and follow recommendations provided on product labels. Labels contain instructions for safe and effective use of the cleaning product including precautions you should take when applying the product, such as wearing gloves or eye protection and making sure you have good ventilation during use of the product.  Remove gloves and wash hands immediately after cleaning.  Monitor yourself for signs and symptoms of illness Caregivers and household members are considered close contacts, should monitor their health, and will  be asked to limit movement outside of the home to the extent possible. Follow the monitoring steps for close contacts listed on the symptom monitoring form.   ? If you have additional questions, contact your local health department or call the epidemiologist on call at [phone] (available 24/7). ? This guidance is subject to change. For the most up-to-date guidance from CDC, please refer to their website: TripMetro.hu    Activity:  As tolerated   Discharge Instructions    Call MD for:  difficulty breathing, headache or visual disturbances   Complete by: As directed    Diet - low sodium heart healthy   Complete by: As directed    Discharge instructions   Complete by: As directed    21 days of isolation from 04/08/2020  You are scheduled for outpatient Remdesivir  infusions at 1pm on Friday 9/17 at [facility]. Please inform the patient to park at[address], [place], as staff will be escorting the patient through the east entrance of the hospital.Appointments take approximately 45 minutes.   There is a wave flag banner located near the entrance on [address]. Turn into this entranceand immediatelyturn left and park in 1 of the 5 designated Covid Infusion Parking spots. There is a phone number on the sign, please call and let the staff know what spot you are in and we will come out and get you. For questions call [phone].   Follow with Primary MD  in 1-2 weeks  Please ask your primary care practitioner to repeat two-view chest x-ray in 4 to 6 weeks.  Please get a complete blood count and chemistry panel checked by your Primary MD at your next visit, and again as instructed by your Primary MD.  Get Medicines reviewed and adjusted: Please take all your medications with you for your next visit with your Primary MD  Laboratory/radiological data: Please request your Primary MD to go over all hospital tests and procedure/radiological results at the follow up, please ask your Primary MD to get all Hospital records sent to his/her office.  In some cases, they will be blood work, cultures and biopsy results pending at the time of your discharge. Please request that your primary care M.D. follows up on these results.  Also Note the following: If you experience worsening of your admission symptoms, develop shortness of breath, life threatening emergency, suicidal or homicidal thoughts you must seek medical attention immediately by calling 911 or calling your MD immediately  if symptoms less severe.  You must read complete instructions/literature along with all the possible adverse reactions/side effects for all the Medicines you take and that have been prescribed to you. Take any new Medicines after you have completely understood and accpet  all the possible adverse reactions/side effects.   Do not drive when taking Pain medications or sleeping medications (Benzodaizepines)  Do not take more than prescribed Pain, Sleep and Anxiety Medications. It is not advisable to combine anxiety,sleep and pain medications without talking with your primary care practitioner  Special Instructions: If you have smoked or chewed Tobacco  in the last 2 yrs please stop smoking, stop any regular Alcohol  and or any Recreational drug use.  Wear Seat belts while driving.  Please note: You were cared for by a hospitalist during your hospital stay. Once you are discharged, your primary care physician will handle any further medical issues. Please note that NO REFILLS for any discharge medications will be authorized once you are discharged, as it  is imperative that you return to your primary care physician (or establish a relationship with a primary care physician if you do not have one) for your post hospital discharge needs so that they can reassess your need for medications and monitor your lab values.   Increase activity slowly   Complete by: As directed      Allergies as of 04/17/2020      Reactions   Penicillins    Zithromax [azithromycin]       Medication List    STOP taking these medications   dexamethasone 4 MG tablet Commonly known as: DECADRON   doxycycline 100 MG tablet Commonly known as: VIBRA-TABS     TAKE these medications   albuterol 108 (90 Base) MCG/ACT inhaler Commonly known as: VENTOLIN HFA Inhale 2 puffs into the lungs every 6 (six) hours as needed for wheezing or shortness of breath.   benzonatate 100 MG capsule Commonly known as: Tessalon Perles Take 1 capsule (100 mg total) by mouth every 6 (six) hours as needed for cough.   ciprofloxacin 0.3 % ophthalmic solution Commonly known as: Ciloxan Place 2 drops into the left eye every 2 (two) hours while awake. Administer 1 drop, every 2 hours, while awake, for 2 days.  Then 1 drop, every 4 hours, while awake, for the next 5 days.   feeding supplement (ENSURE ENLIVE) Liqd Take 237 mLs by mouth 3 (three) times daily between meals.   lisinopril 40 MG tablet Commonly known as: ZESTRIL Take 40 mg by mouth daily.   omeprazole 40 MG capsule Commonly known as: PRILOSEC Take 40 mg by mouth daily.   predniSONE 10 MG tablet Commonly known as: DELTASONE Take 40 mg daily for 1 day, 30 mg daily for 1 day, 20 mg daily for 1 days,10 mg daily for 1 day, then stop   tobramycin-dexamethasone ophthalmic solution Commonly known as: TOBRADEX Place 1 drop into the left eye in the morning, at noon, and at bedtime.       Follow-up Information    [facility] Follow up on 04/30/2020.   Why: 1015 am  Contact information: [address] [place] [address] [phone]             Allergies  Allergen Reactions  . Penicillins   . Zithromax [Azithromycin]       Other Procedures/Studies: DG Chest Port 1 View  Result Date: 04/14/2020 CLINICAL DATA:  COVID positive with symptoms since September 3rd. EXAM: PORTABLE CHEST 1 VIEW COMPARISON:  August 22, 2018 FINDINGS: Mildly decreased lung volumes are seen which is likely, in part, secondary to suboptimal patient inspiration. Mild ill-defined areas of atelectasis and/or early infiltrate are noted within the bilateral lung bases. There is no evidence of a pleural effusion or pneumothorax. The cardiac silhouette is mildly enlarged and unchanged in size. The visualized skeletal structures are unremarkable. IMPRESSION: 1. Mild ill-defined bibasilar atelectasis and/or early infiltrate. Electronically Signed   By: Aram Candela M.D.   On: 04/14/2020 01:29     TODAY-DAY OF DISCHARGE:  Subjective:   Sergio Mcdonald today has no headache,no chest abdominal pain,no new weakness tingling or numbness, feels much better wants to go home today.   Objective:   Blood pressure 117/82,  pulse 78, temperature 98.4 F (36.9 C), temperature source Oral, resp. rate (!) 21, height  (1.727 m), weight 124.7 kg, SpO2 94 %.  Intake/Output Summary (Last 24 hours) at 04/17/2020 1007 Last data filed at 04/17/2020 0957 Gross per  24 hour  Intake 1120 ml  Output 1420 ml  Net -300 ml   Filed Weights   04/14/20 0035  Weight: 124.7 kg    Exam: Awake Alert, Oriented *3, No new F.N deficits, Normal affect .AT,PERRAL Supple Neck,No JVD, No cervical lymphadenopathy appriciated.  Symmetrical Chest wall movement, Good air movement bilaterally, CTAB RRR,No Gallops,Rubs or new Murmurs, No Parasternal Heave +ve B.Sounds, Abd Soft, Non tender, No organomegaly appriciated, No rebound -guarding or rigidity. No Cyanosis, Clubbing or edema, No new Rash or bruise   PERTINENT RADIOLOGIC STUDIES: DG Chest Port 1 View  Result Date: 04/14/2020 CLINICAL DATA:  COVID positive with symptoms since September 3rd. EXAM: PORTABLE CHEST 1 VIEW COMPARISON:  August 22, 2018 FINDINGS: Mildly decreased lung volumes are seen which is likely, in part, secondary to suboptimal patient inspiration. Mild ill-defined areas of atelectasis and/or early infiltrate are noted within the bilateral lung bases. There is no evidence of a pleural effusion or pneumothorax. The cardiac silhouette is mildly enlarged and unchanged in size. The visualized skeletal structures are unremarkable. IMPRESSION: 1. Mild ill-defined bibasilar atelectasis and/or early infiltrate. Electronically Signed   By: Aram Candela M.D.   On: 04/14/2020 01:29     PERTINENT LAB RESULTS: CBC: Recent Labs    04/16/20 0910 04/17/20 0513  WBC 6.7 8.4  HGB 15.1 15.7  HCT 45.8 46.8  PLT 346 411*   CMET CMP     Component Value Date/Time   NA 136 04/17/2020 0513   K 4.5 04/17/2020 0513   CL 99 04/17/2020 0513   CO2 26 04/17/2020 0513   GLUCOSE 124 (H) 04/17/2020 0513   BUN 22 (H) 04/17/2020 0513   CREATININE 0.82 04/17/2020 0513    CALCIUM 9.1 04/17/2020 0513   PROT 6.6 04/17/2020 0513   ALBUMIN 3.4 (L) 04/17/2020 0513   AST 63 (H) 04/17/2020 0513   ALT 157 (H) 04/17/2020 0513   ALKPHOS 38 04/17/2020 0513   BILITOT 1.0 04/17/2020 0513   GFRNONAA >60 04/17/2020 0513   GFRAA >60 04/17/2020 0513    GFR Estimated Creatinine Clearance: 129.3 mL/min (by C-G formula based on SCr of 0.82 mg/dL). No results for input(s): LIPASE, AMYLASE in the last 72 hours. No results for input(s): CKTOTAL, CKMB, CKMBINDEX, TROPONINI in the last 72 hours. Invalid input(s): POCBNP Recent Labs    04/16/20 0910 04/17/20 0513  DDIMER 0.30 0.31   No results for input(s): HGBA1C in the last 72 hours. No results for input(s): CHOL, HDL, LDLCALC, TRIG, CHOLHDL, LDLDIRECT in the last 72 hours. No results for input(s): TSH, T4TOTAL, T3FREE, THYROIDAB in the last 72 hours.  Invalid input(s): FREET3 No results for input(s): VITAMINB12, FOLATE, FERRITIN, TIBC, IRON, RETICCTPCT in the last 72 hours. Coags: No results for input(s): INR in the last 72 hours.  Invalid input(s): PT Microbiology: Recent Results (from the past 240 hour(s))  Blood Culture (routine x 2)     Status: None (Preliminary result)   Collection Time: 04/14/20  1:30 AM   Specimen: BLOOD  Result Value Ref Range Status   Specimen Description   Final    BLOOD RIGHT ANTECUBITAL Performed at [facility], [address] [place], [place] [address]    Special Requests   Final    BOTTLES DRAWN AEROBIC AND ANAEROBIC Blood Culture adequate volume Performed at [facility], [address] [place], [place] [address]    Culture   Final    NO GROWTH 2 DAYS Performed at  [facility], [address], [place], [place] [address]    Report Status PENDING  Incomplete  Blood Culture (routine x 2)     Status: None (Preliminary result)   Collection Time: 04/14/20  1:40 AM   Specimen: BLOOD LEFT FOREARM  Result Value Ref Range Status   Specimen  Description   Final    BLOOD LEFT FOREARM Performed at [facility], [address] [place], [place] [address]    Special Requests   Final    BOTTLES DRAWN AEROBIC AND ANAEROBIC Blood Culture adequate volume Performed at [facility], [address] [place], [place] [address]    Culture   Final    NO GROWTH 2 DAYS Performed at [facility], [address] [place], [place] [address]    Report Status PENDING  Incomplete    FURTHER DISCHARGE INSTRUCTIONS:  Get Medicines reviewed and adjusted: Please take all your medications with you for your next visit with your Primary MD  Laboratory/radiological data: Please request your Primary MD to go over all hospital tests and procedure/radiological results at the follow up, please ask your Primary MD to get all Hospital records sent to his/her office.  In some cases, they will be blood work, cultures and biopsy results pending at the time of your discharge. Please request that your primary care M.D. goes through all the records of your hospital data and follows up on these results.  Also Note the following: If you experience worsening of your admission symptoms, develop shortness of breath, life threatening emergency, suicidal or homicidal thoughts you must seek medical attention immediately by calling 911 or calling your MD immediately  if symptoms less severe.  You must read complete instructions/literature along with all the possible adverse reactions/side effects for all the Medicines you take and that have been prescribed to you. Take any new Medicines after you have completely understood and accpet all the possible adverse reactions/side effects.   Do not drive when taking Pain medications or sleeping medications (Benzodaizepines)  Do not take more than prescribed Pain, Sleep and Anxiety Medications. It is not advisable to combine anxiety,sleep and pain medications without talking with your primary care  practitioner  Special Instructions: If you have smoked or chewed Tobacco  in the last 2 yrs please stop smoking, stop any regular Alcohol  and or any Recreational drug use.  Wear Seat belts while driving.  Please note: You were cared for by a hospitalist during your hospital stay. Once you are discharged, your primary care physician will handle any further medical issues. Please note that NO REFILLS for any discharge medications will be authorized once you are discharged, as it is imperative that you return to your primary care physician (or establish a relationship with a primary care physician if you do not have one) for your post hospital discharge needs so that they can reassess your need for medications and monitor your lab values.  Total Time spent coordinating discharge including counseling, education and face to face time equals 345 minutes.  SignedJeoffrey Massed 04/17/2020 10:07 AM

## 2020-04-17 NOTE — Progress Notes (Signed)
Patient was discharged home by MD order; discharged instructions  review and give to patient with care notes; IV DIC; skin intact; patient will be escorted to the car by nurse tech via wheelchair.  

## 2020-04-17 NOTE — Progress Notes (Signed)
Patient scheduled for outpatient Remdesivir infusions at 1pm on Friday 9/17 at [facility]. Please inform the patient to park at [address] [place], [place], as staff will be escorting the patient through the east entrance of the hospital. Appointments take approximately 45 minutes.    There is a wave flag banner located near the entrance on [address]. Turn into this entrance and immediately turn left and park in 1 of the 5 designated Covid Infusion Parking spots. There is a phone number on the sign, please call and let the staff know what spot you are in and we will come out and get you. For questions call [phone].  Thanks.

## 2020-04-18 ENCOUNTER — Ambulatory Visit (HOSPITAL_COMMUNITY)
Admit: 2020-04-18 | Discharge: 2020-04-18 | Disposition: A | Discharge status: Home / Self Care | Payer: BC Managed Care – PPO | Source: Ambulatory Visit | Attending: Pulmonary Disease | Admitting: Pulmonary Disease

## 2020-04-18 DIAGNOSIS — U071 COVID-19: Secondary | ICD-10-CM | POA: Diagnosis present

## 2020-04-18 DIAGNOSIS — J1282 Pneumonia due to coronavirus disease 2019: Secondary | ICD-10-CM | POA: Insufficient documentation

## 2020-04-18 MED ORDER — FAMOTIDINE IN NACL 20-0.9 MG/50ML-% IV SOLN: 20.0000 mg | Freq: Once | INTRAVENOUS | Status: DC | PRN

## 2020-04-18 MED ORDER — DIPHENHYDRAMINE HCL 50 MG/ML IJ SOLN: 50.0000 mg | Freq: Once | INTRAMUSCULAR | Status: DC | PRN

## 2020-04-18 MED ORDER — METHYLPREDNISOLONE SODIUM SUCC 125 MG IJ SOLR: 125.0000 mg | Freq: Once | INTRAMUSCULAR | Status: DC | PRN

## 2020-04-18 MED ORDER — SODIUM CHLORIDE 0.9 % IV SOLN
100.0000 mg | Freq: Once | INTRAVENOUS | Status: AC
  Administered 2020-04-18: 100 mg via INTRAVENOUS
  Filled 2020-04-18: qty 20

## 2020-04-18 MED ORDER — SODIUM CHLORIDE 0.9 % IV SOLN: INTRAVENOUS | Status: DC | PRN

## 2020-04-18 MED ORDER — ALBUTEROL SULFATE HFA 108 (90 BASE) MCG/ACT IN AERS: 2.0000 | INHALATION_SPRAY | Freq: Once | RESPIRATORY_TRACT | Status: DC | PRN

## 2020-04-18 MED ORDER — EPINEPHRINE 0.3 MG/0.3ML IJ SOAJ: 0.3000 mg | Freq: Once | INTRAMUSCULAR | Status: DC | PRN

## 2020-04-18 NOTE — Progress Notes (Signed)
°  Diagnosis: COVID-19  Physician: Patrick Wright, MD  Procedure: Covid Infusion Clinic Med: remdesivir infusion - Provided patient with remdesivir fact sheet for patients, parents and caregivers prior to infusion.  Complications: No immediate complications noted.  Discharge: Discharged home   Sergio Mcdonald 04/18/2020  

## 2020-04-18 NOTE — Discharge Instructions (Signed)
10 Things You Can Do to Manage Your COVID-19 Symptoms at Home If you have possible or confirmed COVID-19: 1. Stay home from work and school. And stay away from other public places. If you must go out, avoid using any kind of public transportation, ridesharing, or taxis. 2. Monitor your symptoms carefully. If your symptoms get worse, call your healthcare provider immediately. 3. Get rest and stay hydrated. 4. If you have a medical appointment, call the healthcare provider ahead of time and tell them that you have or may have COVID-19. 5. For medical emergencies, call 911 and notify the dispatch personnel that you have or may have COVID-19. 6. Cover your cough and sneezes with a tissue or use the inside of your elbow. 7. Wash your hands often with soap and water for at least 20 seconds or clean your hands with an alcohol-based hand sanitizer that contains at least 60% alcohol. 8. As much as possible, stay in a specific room and away from other people in your home. Also, you should use a separate bathroom, if available. If you need to be around other people in or outside of the home, wear a mask. 9. Avoid sharing personal items with other people in your household, like dishes, towels, and bedding. 10. Clean all surfaces that are touched often, like counters, tabletops, and doorknobs. Use household cleaning sprays or wipes according to the label instructions. cdc.gov/coronavirus 01/31/2019 This information is not intended to replace advice given to you by your health care provider. Make sure you discuss any questions you have with your health care provider. Document Revised: 07/05/2019 Document Reviewed: 07/05/2019 Elsevier Patient Education  2020 Elsevier Inc.  

## 2020-04-19 LAB — CULTURE, BLOOD (ROUTINE X 2)
Culture: NO GROWTH
Culture: NO GROWTH
Special Requests: ADEQUATE
Special Requests: ADEQUATE

## 2020-04-30 ENCOUNTER — Other Ambulatory Visit: Payer: Self-pay

## 2020-04-30 ENCOUNTER — Ambulatory Visit (INDEPENDENT_AMBULATORY_CARE_PROVIDER_SITE_OTHER): Payer: BC Managed Care – PPO | Admitting: Nurse Practitioner

## 2020-04-30 VITALS — BP 110/78 | HR 80 | Temp 97.9°F | Resp 14 | Ht 68.0 in | Wt 256.0 lb

## 2020-04-30 DIAGNOSIS — U071 COVID-19: Secondary | ICD-10-CM | POA: Diagnosis not present

## 2020-04-30 DIAGNOSIS — R748 Abnormal levels of other serum enzymes: Secondary | ICD-10-CM | POA: Diagnosis not present

## 2020-04-30 DIAGNOSIS — R5381 Other malaise: Secondary | ICD-10-CM | POA: Diagnosis not present

## 2020-04-30 NOTE — Progress Notes (Signed)
@Patient  ID: , male    DOB: 16-Feb-1966, 54 y.o.   MRN: [id number]  Chief Complaint  Patient presents with  . Hospitalization Follow-up    Post covid- SOB upon walking just around house and when doing tasks.Has started low paced treadmill once this week. Chest pain when yawningh and had some dizziness last week. Concerned with needing extended leave from work as he can not perform to par due to SOB and weakness.      Referring provider: No ref. provider found   Brief Narrative: Patient is a54 y.o.malewith PMHx ofHTN-who tested positive for COVID-19 on 9/7-subsequently received monoclonal antibody on 9/10 at [facility]-presented on 9/13 with shortness of breath-found to have acute hypoxic respiratory failure secondary to COVID-19 pneumonia.  COVID-19 vaccinated status: Unvaccinated  Significant Events: 9/7>>COVID-19 positive 9/13>> Ad[facility] to [facility] for[facility]oxia due to COVID-19 pneumonia 04/17/20>>hospital discharge  Significant studies: 9/13>>Chest x-ray:Mild ill-defined bibasilar atelectasis/early infiltrate.  COVID-19 medications: Steroids:9/13 Remdesivir:9/13>> completed outpatient  Antibiotics: None  Microbiology data: 9/13>>blood culture:No growth   HPI  Patient presents today for post COVID care clinic visit / hospital follow up.  Patient states that overall he has been doing well since hospital discharge.  He does feel that he is slowly improving.  He does still complain of some shortness of breath when performing activities of daily living.  He is trying to stay active.  He does have to take frequent rest breaks.  He is still having fatigue and weakness.  He is trying to stay well-hydrated and eating well.  He has lost around 25 pounds since diagnosed with Covid.  Patient was walked in the office today and O2 sats did remain above 93% for the entire walk and heart rate was stable. Denies f/c/s, n/v/d, hemoptysis, PND, chest pain or edema.       Allergies  Allergen Reactions  . Penicillins   . Zithromax [Azithromycin]      There is no immunization history on file for this patient.  Past Medical History:  Diagnosis Date  . Hypertension     Tobacco History: Social History   Tobacco Use  Smoking Status Never Smoker  Smokeless Tobacco Never Used   Counseling given: Yes   Outpatient Encounter Medications as of 04/30/2020  Medication Sig  . albuterol (VENTOLIN HFA) 108 (90 Base) MCG/ACT inhaler Inhale 2 puffs into the lungs every 6 (six) hours as needed for wheezing or shortness of breath.  . benzonatate (TESSALON PERLES) 100 MG capsule Take 1 capsule (100 mg total) by mouth every 6 (six) hours as needed for cough.  . ciprofloxacin (CILOXAN) 0.3 % ophthalmic solution Place 2 drops into the left eye every 2 (two) hours while awake. Administer 1 drop, every 2 hours, while awake, for 2 days. Then 1 drop, every 4 hours, while awake, for the next 5 days.  . feeding supplement, ENSURE ENLIVE, (ENSURE ENLIVE) LIQD Take 237 mLs by mouth 3 (three) times daily between meals.  05/02/2020 lisinopril (ZESTRIL) 40 MG tablet Take 40 mg by mouth daily.  Marland Kitchen omeprazole (PRILOSEC) 40 MG capsule Take 40 mg by mouth daily.  Marland Kitchen tobramycin-dexamethasone (TOBRADEX) ophthalmic solution Place 1 drop into the left eye in the morning, at noon, and at bedtime.   . [DISCONTINUED] predniSONE (DELTASONE) 10 MG tablet Take 40 mg daily for 1 day, 30 mg daily for 1 day, 20 mg daily for 1 days,10 mg daily for 1 day, then stop   No facility-administered encounter medications on file as of 04/30/2020.  Review of Systems  Review of Systems  Constitutional: Positive for fatigue. Negative for fever.  HENT: Negative.   Respiratory: Positive for shortness of breath. Negative for cough.   Cardiovascular: Negative.  Negative for chest pain, palpitations and leg swelling.  Gastrointestinal: Negative.   Allergic/Immunologic: Negative.   Neurological: Positive  for weakness.  Psychiatric/Behavioral: Negative.        Physical Exam  BP 110/78   Pulse 80   Temp 97.9 F (36.6 C) (Tympanic)   Resp 14   Ht 5\' 8"  (1.727 m)   Wt 256 lb (116.1 kg)   SpO2 95%   BMI 38.92 kg/m   Wt Readings from Last 5 Encounters:  04/30/20 256 lb (116.1 kg)  04/14/20 275 lb (124.7 kg)  03/30/20 280 lb (127 kg)     Physical Exam Vitals and nursing note reviewed.  Constitutional:      General: He is not in acute distress.    Appearance: He is well-developed.  Cardiovascular:     Rate and Rhythm: Normal rate and regular rhythm.  Pulmonary:     Effort: Pulmonary effort is normal.     Comments: Rhonchi right lower lung field Musculoskeletal:     Right lower leg: No edema.     Left lower leg: No edema.  Skin:    General: Skin is warm and dry.  Neurological:     Mental Status: He is alert and oriented to person, place, and time.       Imaging: DG Chest Port 1 View  Result Date: 04/14/2020 CLINICAL DATA:  COVID positive with symptoms since September 3rd. EXAM: PORTABLE CHEST 1 VIEW COMPARISON:  August 22, 2018 FINDINGS: Mildly decreased lung volumes are seen which is likely, in part, secondary to suboptimal patient inspiration. Mild ill-defined areas of atelectasis and/or early infiltrate are noted within the bilateral lung bases. There is no evidence of a pleural effusion or pneumothorax. The cardiac silhouette is mildly enlarged and unchanged in size. The visualized skeletal structures are unremarkable. IMPRESSION: 1. Mild ill-defined bibasilar atelectasis and/or early infiltrate. Electronically Signed   By: August 24, 2018 M.D.   On: 04/14/2020 01:29     Assessment & Plan:   COVID-19 virus infection Physical deconditioning:   Stay well hydrated  Stay active  Deep breathing exercises  May start vitamin C 2,000 mg daily, vitamin D3 2,000 IU daily, Zinc 220 mg daily, and Quercetin 500 mg twice daily  May take tylenol or fever or  pain  May take mucinex DM twice daily  Will order follow up CBC and CMP   Follow up:  Follow up in 2 weeks or sooner if needed - will need repeat chest x ray       04/16/2020, NP 04/30/2020

## 2020-04-30 NOTE — Assessment & Plan Note (Signed)
Physical deconditioning:   Stay well hydrated  Stay active  Deep breathing exercises  May start vitamin C 2,000 mg daily, vitamin D3 2,000 IU daily, Zinc 220 mg daily, and Quercetin 500 mg twice daily  May take tylenol or fever or pain  May take mucinex DM twice daily  Will order follow up CBC and CMP   Follow up:  Follow up in 2 weeks or sooner if needed - will need repeat chest x ray

## 2020-04-30 NOTE — Patient Instructions (Addendum)
Covid 19 Physical deconditioning:   Stay well hydrated  Stay active  Deep breathing exercises  May start vitamin C 2,000 mg daily, vitamin D3 2,000 IU daily, Zinc 220 mg daily, and Quercetin 500 mg twice daily  May take tylenol or fever or pain  May take mucinex DM twice daily  Will order follow up CBC and CMP   Follow up:  Follow up in 2 weeks or sooner if needed - will need repeat chest x ray

## 2020-05-01 LAB — COMPREHENSIVE METABOLIC PANEL
ALT: 37 IU/L (ref 0–44)
AST: 15 IU/L (ref 0–40)
Albumin/Globulin Ratio: 2.2 (ref 1.2–2.2)
Albumin: 4.7 g/dL (ref 3.8–4.9)
Alkaline Phosphatase: 58 IU/L (ref 44–121)
BUN/Creatinine Ratio: 16 (ref 9–20)
BUN: 18 mg/dL (ref 6–24)
Bilirubin Total: 0.6 mg/dL (ref 0.0–1.2)
CO2: 22 mmol/L (ref 20–29)
Calcium: 9.5 mg/dL (ref 8.7–10.2)
Chloride: 99 mmol/L (ref 96–106)
Creatinine, Ser: 1.12 mg/dL (ref 0.76–1.27)
GFR calc Af Amer: 84 mL/min/{1.73_m2} (ref >59)
GFR calc non Af Amer: 73 mL/min/{1.73_m2} (ref >59)
Globulin, Total: 2.1 g/dL (ref 1.5–4.5)
Glucose: 98 mg/dL (ref 65–99)
Potassium: 4.9 mmol/L (ref 3.5–5.2)
Sodium: 139 mmol/L (ref 134–144)
Total Protein: 6.8 g/dL (ref 6.0–8.5)

## 2020-05-01 LAB — CBC
Hematocrit: 43.1 % (ref 37.5–51.0)
Hemoglobin: 14.5 g/dL (ref 13.0–17.7)
MCH: 27.5 pg (ref 26.6–33.0)
MCHC: 33.6 g/dL (ref 31.5–35.7)
MCV: 82 fL (ref 79–97)
Platelets: 243 10*3/uL (ref 150–450)
RBC: 5.27 x10E6/uL (ref 4.14–5.80)
RDW: 13.3 % (ref 11.6–15.4)
WBC: 9.9 10*3/uL (ref 3.4–10.8)

## 2020-05-14 ENCOUNTER — Ambulatory Visit: Payer: BC Managed Care – PPO

## 2020-05-15 ENCOUNTER — Ambulatory Visit (INDEPENDENT_AMBULATORY_CARE_PROVIDER_SITE_OTHER): Payer: BC Managed Care – PPO | Admitting: Nurse Practitioner

## 2020-05-15 ENCOUNTER — Other Ambulatory Visit: Payer: Self-pay

## 2020-05-15 ENCOUNTER — Ambulatory Visit
Admission: RE | Admit: 2020-05-15 | Discharge: 2020-05-15 | Disposition: A | Discharge status: Home / Self Care | Payer: BC Managed Care – PPO | Source: Ambulatory Visit | Attending: Nurse Practitioner | Admitting: Nurse Practitioner

## 2020-05-15 VITALS — BP 128/88 | HR 85 | Temp 97.6°F | Wt 262.0 lb

## 2020-05-15 DIAGNOSIS — U071 COVID-19: Secondary | ICD-10-CM | POA: Diagnosis not present

## 2020-05-15 NOTE — Patient Instructions (Addendum)
COVID-19 virus infection Physical deconditioning:   Stay well hydrated  Stay active  Deep breathing exercises  May start vitamin C 2,000 mg daily, vitamin D3 2,000 IU daily, Zinc 220 mg daily, and Quercetin 500 mg twice daily  May take tylenol or fever or pain  May take mucinex DM twice daily     Follow up:  Follow up if needed - after repeat chest x ray

## 2020-05-15 NOTE — Assessment & Plan Note (Signed)
Physical deconditioning:   Stay well hydrated  Stay active  Deep breathing exercises  May start vitamin C 2,000 mg daily, vitamin D3 2,000 IU daily, Zinc 220 mg daily, and Quercetin 500 mg twice daily  May take tylenol or fever or pain  May take mucinex DM twice daily     Follow up:  Follow up if needed - after repeat chest x ray

## 2020-05-15 NOTE — Progress Notes (Signed)
@Patient  ID: , male    DOB: Oct 15, 1965, 54 y.o.   MRN: [id number]  Chief Complaint  Patient presents with  . Follow-up    Patient stated he is doing much better just slight cough    Referring provider: No ref. provider found   Brief Narrative: Patient is a54 y.o.malewith PMHx ofHTN-who tested positive for COVID-19 on 9/7-subsequently received monoclonal antibody on 9/10 at [facility]-presented on 9/13 with shortness of breath-found to have acute hypoxic respiratory failure secondary to COVID-19 pneumonia.  COVID-19 vaccinated status: Unvaccinated  Significant Events: 9/7>>COVID-19 positive 9/13>> Admit to [facility] forhypoxia due to COVID-19 pneumonia 04/17/20>>hospital discharge  Significant studies: 9/13>>Chest x-ray:Mild ill-defined bibasilar atelectasis/early infiltrate.  COVID-19 medications: Steroids:9/13 Remdesivir:9/13>> completed outpatient  Antibiotics: None  Microbiology data: 9/13>>blood culture:No growth  HPI  Patient presents today for post Covid care clinic visit/follow-up.  Patient states that since his last visit here he has been doing well.  His last labs were normal.  He states that he is no longer having shortness of breath.  He does have a slight cough at times.  He has been trying to stay active.  Denies any recent fever.  Will develop chest x-ray today. Denies f/c/s, n/v/d, hemoptysis, PND, chest pain or edema.       Allergies  Allergen Reactions  . Penicillins   . Zithromax [Azithromycin]      There is no immunization history on file for this patient.  Past Medical History:  Diagnosis Date  . Hypertension     Tobacco History: Social History   Tobacco Use  Smoking Status Never Smoker  Smokeless Tobacco Never Used   Counseling given: Not Answered   Outpatient Encounter Medications as of 05/15/2020  Medication Sig  . albuterol (VENTOLIN HFA) 108 (90 Base) MCG/ACT inhaler Inhale 2 puffs into the lungs  every 6 (six) hours as needed for wheezing or shortness of breath.  . ciprofloxacin (CILOXAN) 0.3 % ophthalmic solution Place 2 drops into the left eye every 2 (two) hours while awake. Administer 1 drop, every 2 hours, while awake, for 2 days. Then 1 drop, every 4 hours, while awake, for the next 5 days.  . feeding supplement, ENSURE ENLIVE, (ENSURE ENLIVE) LIQD Take 237 mLs by mouth 3 (three) times daily between meals.  05/17/2020 lisinopril (ZESTRIL) 40 MG tablet Take 40 mg by mouth daily.  Marland Kitchen omeprazole (PRILOSEC) 40 MG capsule Take 40 mg by mouth daily.  Marland Kitchen tobramycin-dexamethasone (TOBRADEX) ophthalmic solution Place 1 drop into the left eye in the morning, at noon, and at bedtime.   . benzonatate (TESSALON PERLES) 100 MG capsule Take 1 capsule (100 mg total) by mouth every 6 (six) hours as needed for cough. (Patient not taking: Reported on 05/15/2020)   No facility-administered encounter medications on file as of 05/15/2020.     Review of Systems  Review of Systems  Constitutional: Negative for fatigue and fever.  HENT: Negative.   Respiratory: Positive for cough. Negative for shortness of breath.   Cardiovascular: Negative.  Negative for chest pain, palpitations and leg swelling.  Gastrointestinal: Negative.   Allergic/Immunologic: Negative.   Neurological: Negative.   Psychiatric/Behavioral: Negative.        Physical Exam  BP 128/88   Pulse 85   Temp 97.6 F (36.4 C)   Wt 262 lb 0.1 oz (118.8 kg)   SpO2 97%   BMI 39.84 kg/m   Wt Readings from Last 5 Encounters:  05/15/20 262 lb 0.1 oz (118.8 kg)  04/30/20  256 lb (116.1 kg)  04/14/20 275 lb (124.7 kg)  03/30/20 280 lb (127 kg)     Physical Exam Vitals and nursing note reviewed.  Constitutional:      General: He is not in acute distress.    Appearance: He is well-developed.  Cardiovascular:     Rate and Rhythm: Normal rate and regular rhythm.  Pulmonary:     Effort: Pulmonary effort is normal.     Breath sounds:  Normal breath sounds.  Musculoskeletal:     Right lower leg: No edema.     Left lower leg: No edema.  Skin:    General: Skin is warm and dry.  Neurological:     Mental Status: He is alert and oriented to person, place, and time.       Assessment & Plan:   COVID-19 virus infection Physical deconditioning:   Stay well hydrated  Stay active  Deep breathing exercises  May start vitamin C 2,000 mg daily, vitamin D3 2,000 IU daily, Zinc 220 mg daily, and Quercetin 500 mg twice daily  May take tylenol or fever or pain  May take mucinex DM twice daily     Follow up:  Follow up if needed - after repeat chest x ray      Ivonne Andrew, NP 05/15/2020

## 2020-05-22 ENCOUNTER — Encounter: Payer: Self-pay | Admitting: Nurse Practitioner

## 2022-01-21 IMAGING — DX DG CHEST 1V PORT
1 series · 1 of 1 positions shown · non-contrast
Comparison: August 22, 2018

CLINICAL DATA: COVID positive with symptoms since [DATE][REDACTED].

EXAM:
PORTABLE CHEST 1 VIEW

[chest ap]
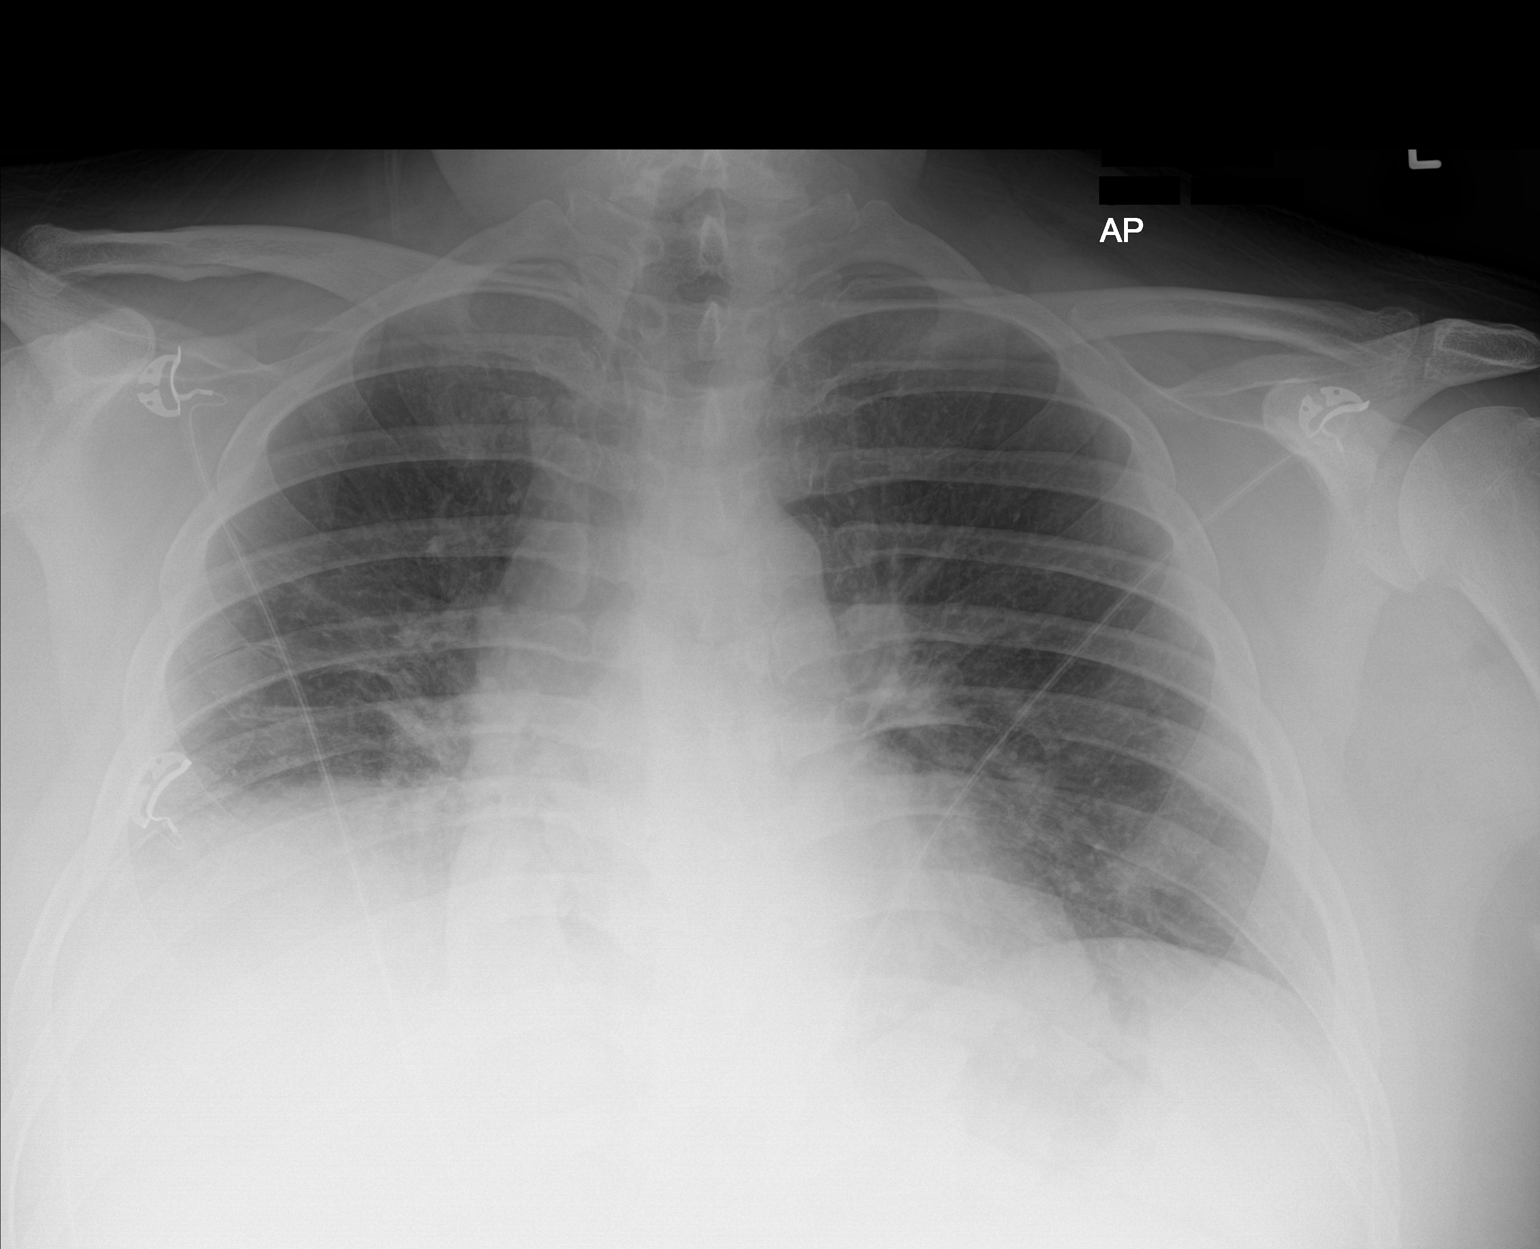

[1 of 1 positions shown; findings below may reference images not displayed]

FINDINGS: Mildly decreased lung volumes are seen which is likely, in part,
secondary to suboptimal patient inspiration. Mild ill-defined areas
of atelectasis and/or early infiltrate are noted within the
bilateral lung bases. There is no evidence of a pleural effusion or
pneumothorax. The cardiac silhouette is mildly enlarged and
unchanged in size. The visualized skeletal structures are
unremarkable.
IMPRESSION: 1. Mild ill-defined bibasilar atelectasis and/or early infiltrate.

## 2022-02-21 IMAGING — CR DG CHEST 2V
2 series · 2 of 2 positions shown · non-contrast
Comparison: 04/14/2020

CLINICAL DATA: PIQ2X-DF pneumonia 04/14/2020, abnormal physical
exam today

EXAM:
CHEST - 2 VIEW

[w chest pa]
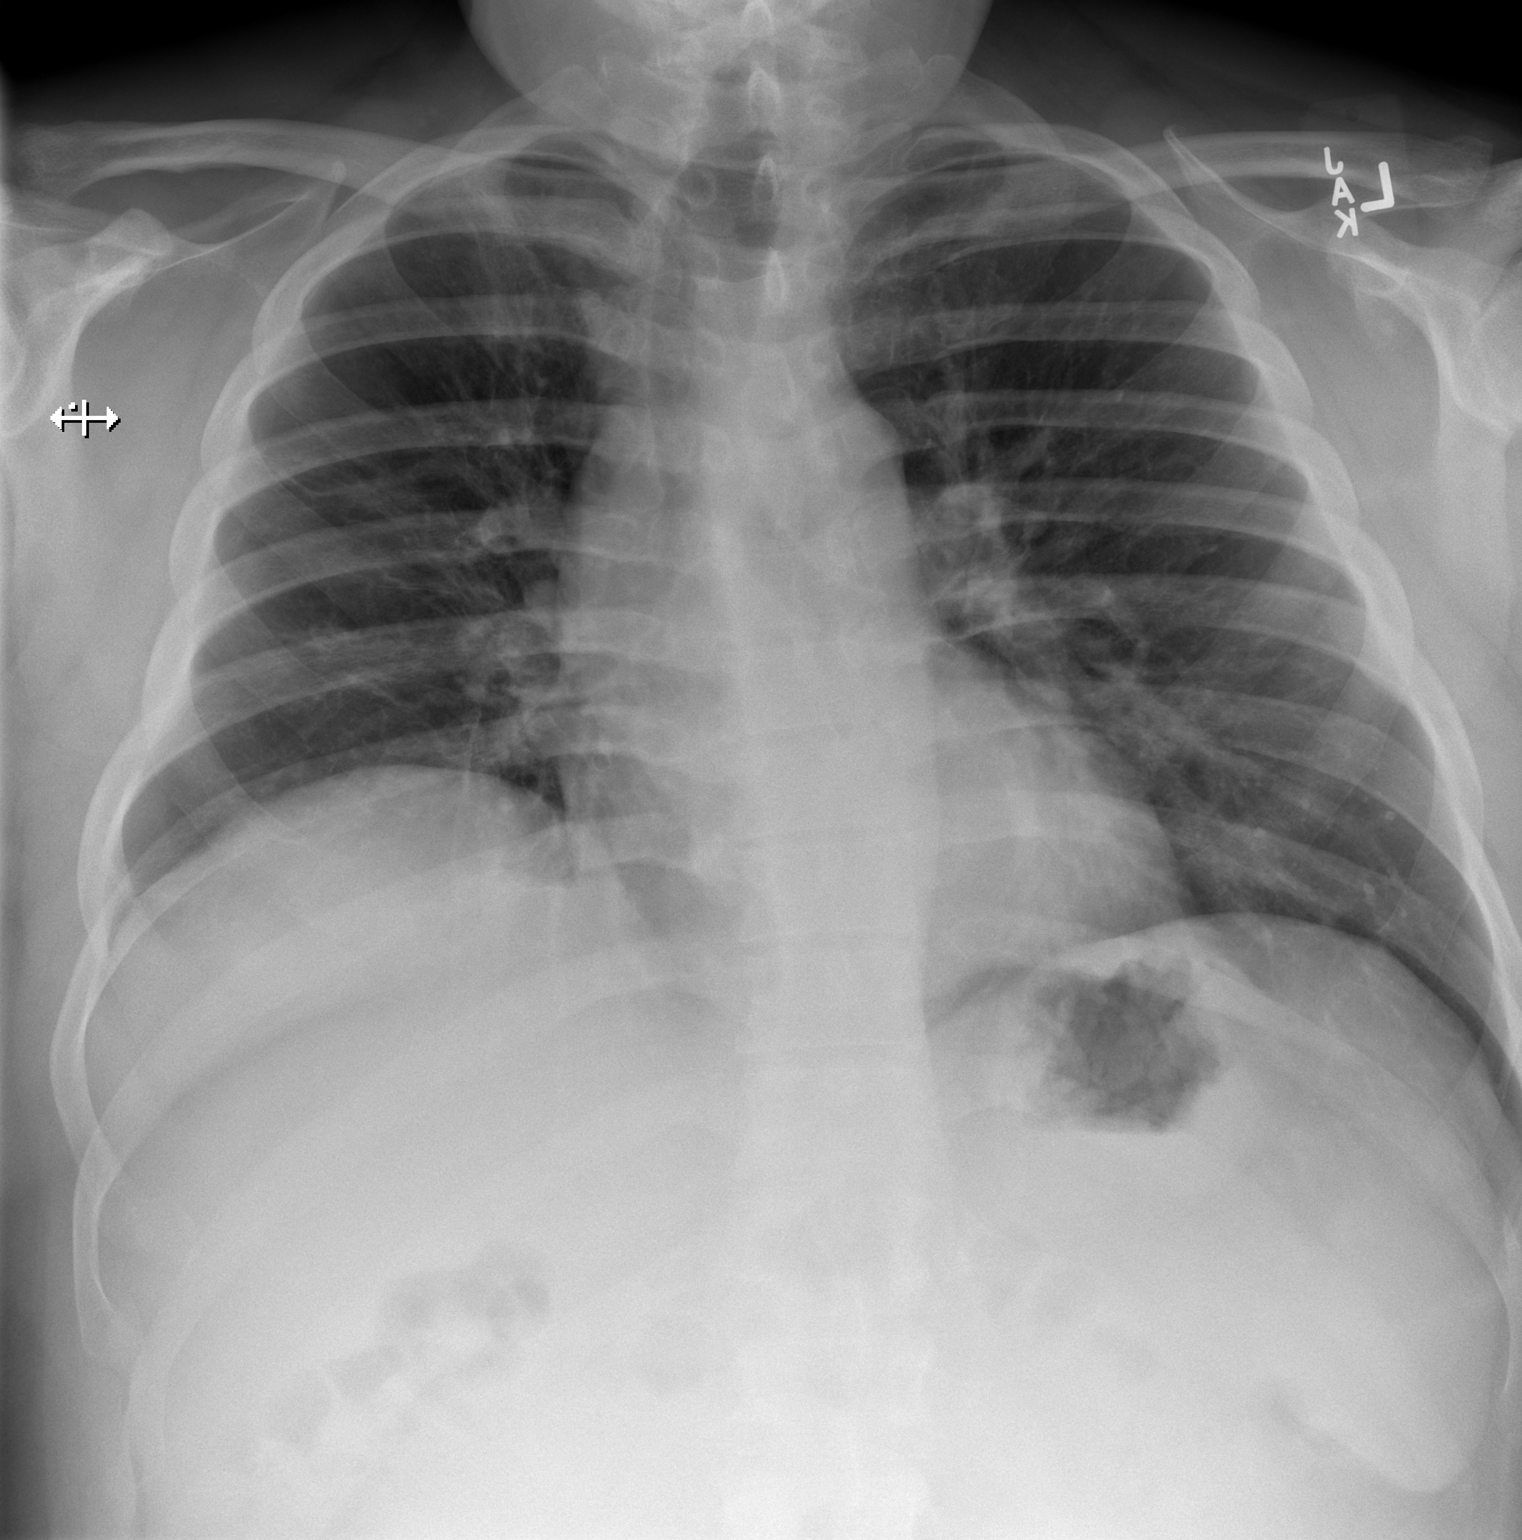

[w chest lat]
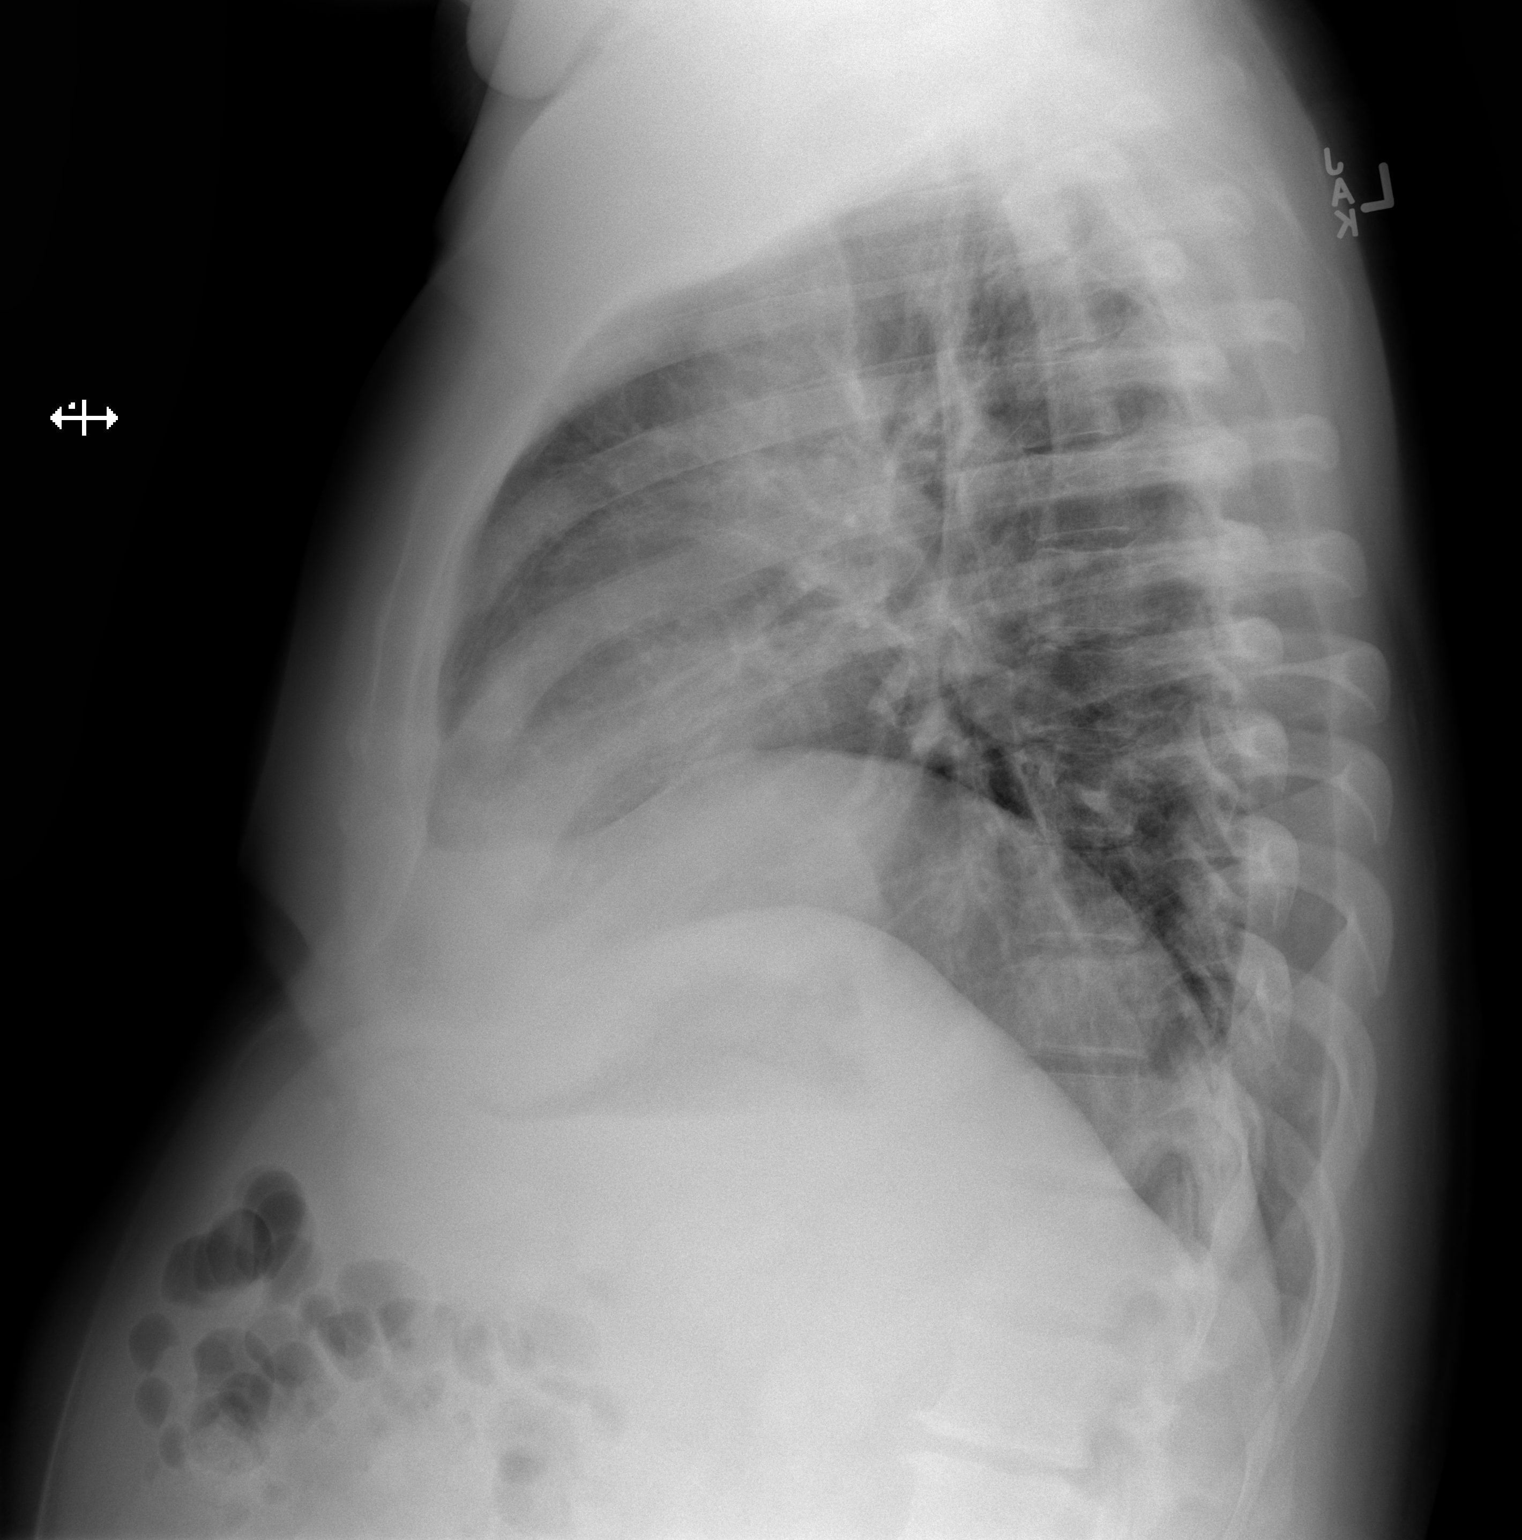

[2 of 2 positions shown; findings below may reference images not displayed]

FINDINGS: Frontal and lateral views of the chest demonstrate a stable cardiac
silhouette. There has been interval clearing of the bibasilar
airspace disease seen previously. No residual consolidation,
effusion, or pneumothorax. Minimal linear opacities in the right
upper lung zone may reflect postinflammatory scarring. No acute bony
abnormalities.
IMPRESSION: 1. Minimal right upper lobe scarring.
2. No residual or Recurrent airspace disease.
